# Patient Record
Sex: Male | Born: 1955 | Race: White | Hispanic: No
Health system: Midwestern US, Community
[De-identification: ages and names within clinical notes are randomized; demographics above are authoritative.]

## PROBLEM LIST (undated history)

## (undated) DIAGNOSIS — C801 Malignant (primary) neoplasm, unspecified: Secondary | ICD-10-CM

## (undated) DIAGNOSIS — Z720 Tobacco use: Secondary | ICD-10-CM

## (undated) DIAGNOSIS — C099 Malignant neoplasm of tonsil, unspecified: Secondary | ICD-10-CM

## (undated) DIAGNOSIS — N50819 Testicular pain, unspecified: Secondary | ICD-10-CM

## (undated) DIAGNOSIS — N3000 Acute cystitis without hematuria: Secondary | ICD-10-CM

## (undated) HISTORY — DX: Malignant neoplasm of tonsil, unspecified: C09.9

## (undated) HISTORY — DX: Tobacco use: Z72.0

## (undated) HISTORY — DX: Malignant (primary) neoplasm, unspecified: C80.1

---

## 2007-11-26 DIAGNOSIS — C099 Malignant neoplasm of tonsil, unspecified: Secondary | ICD-10-CM

## 2007-11-26 HISTORY — PX: TONSILLECTOMY: SUR1361

## 2007-11-26 HISTORY — DX: Malignant neoplasm of tonsil, unspecified: C09.9

## 2007-11-26 HISTORY — PX: NECK DISSECTION: SUR422

## 2012-03-09 ENCOUNTER — Encounter (HOSPITAL_COMMUNITY): Payer: Self-pay | Admitting: Oncology

## 2012-03-09 ENCOUNTER — Encounter (HOSPITAL_COMMUNITY): Payer: Self-pay | Attending: Oncology | Admitting: Oncology

## 2012-03-09 DIAGNOSIS — F17200 Nicotine dependence, unspecified, uncomplicated: Secondary | ICD-10-CM | POA: Insufficient documentation

## 2012-03-09 DIAGNOSIS — C099 Malignant neoplasm of tonsil, unspecified: Secondary | ICD-10-CM

## 2012-03-09 DIAGNOSIS — Z8521 Personal history of malignant neoplasm of larynx: Secondary | ICD-10-CM | POA: Insufficient documentation

## 2012-03-09 DIAGNOSIS — Z09 Encounter for follow-up examination after completed treatment for conditions other than malignant neoplasm: Secondary | ICD-10-CM | POA: Insufficient documentation

## 2012-03-09 DIAGNOSIS — C770 Secondary and unspecified malignant neoplasm of lymph nodes of head, face and neck: Secondary | ICD-10-CM

## 2012-03-09 LAB — COMPREHENSIVE METABOLIC PANEL
ALT: 28 U/L (ref 0–53)
Alkaline Phosphatase: 73 U/L (ref 39–117)
BUN: 18 mg/dL (ref 6–23)
CO2: 28 mEq/L (ref 19–32)
Chloride: 101 mEq/L (ref 96–112)
GFR calc Af Amer: 87 mL/min — ABNORMAL LOW (ref >90)
GFR calc non Af Amer: 75 mL/min — ABNORMAL LOW (ref >90)
Glucose, Bld: 103 mg/dL — ABNORMAL HIGH (ref 70–99)
Potassium: 4 mEq/L (ref 3.5–5.1)
Sodium: 138 mEq/L (ref 135–145)
Total Bilirubin: 0.3 mg/dL (ref 0.3–1.2)

## 2012-03-09 LAB — CBC
HCT: 40.5 % (ref 39.0–52.0)
MCHC: 33.3 g/dL (ref 30.0–36.0)
MCV: 94.6 fL (ref 78.0–100.0)
RDW: 12.9 % (ref 11.5–15.5)

## 2012-03-09 LAB — DIFFERENTIAL
Monocytes Absolute: 0.4 10*3/uL (ref 0.1–1.0)
Neutro Abs: 3.8 10*3/uL (ref 1.7–7.7)

## 2012-03-09 NOTE — Progress Notes (Signed)
Problem #1 history of stage IV B. 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 He is status post surgery consisting a right neck excision left and right tonsillar resection and biopsy of the left nasopharynx the right nasopharynx at the [facility] of [place] by Dr. Jeananne Rama on 03/04/2008. He then went on to be treated by Dr. Kathrin Greathouse and Dr. Merton Border sleeper at [facility] with combination chemotherapy and radiation the chemotherapy consisting of cis-platinum daily for 5 day and 5-FU for 5 day weeks #1 and #5. He finished all radiation therapy as of 07/29/2008 he has remained disease-free since. His followup is with Dr. Hazle Coca at [place] once a year. His insurance did not help pay for his surgery or therapy it sounds like.  He still occasionally smokes a cigarette and occasionally has a glass of wine or beer. He is asymptomatic however on oncologic review of systems. He does relate a history of high triglycerides in the past but did not want to be treated with medications for this so he has altered and  his lifestyle and his diet significantly. He exercises on a regular basis. He states he eats almost no red meat. He promises to have his lipids checked in the near future on a fasting basis.  He is separated times many years but not officially divorced. He does not know his family history due to his being adopted. He smoked approximately a pack a day starting in his teenage years. He states he was never a heavy drinker.  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  His vital signs show height is 6 feet 6 inches weight 222 pounds. He has no lymphadenopathy in the cervical supraclavicular infraclavicular axillary or inguinal areas. His throat is clear tongue is normal in the midline and there are no lesions that are suspicious anywhere in the oral cavity  or throat. He has a well-healed scar on the right neck. He does not however have the formal changes of a radical neck dissection. His lungs are clear but with diminished breath sounds. Some of his skin on his chest face and legs are burned due to exposure to the sun yesterday. His heart shows a regular rhythm and rate without murmur rub or gallop. Some of his teeth are missing within the oral cavity. His abdomen is soft nontender without organomegaly masses. He has no peripheral edema. Pulses are intact. He is right handed. His nails and hands show the changes of [place] from being an Journalist, newspaper. Facial symmetry appears intact. He is alert he is oriented.  He needs some blood tests today and I think he should have a low-dose chest CT scan without contrast due to smoking history of approximately 30 or more pack years of smoking. I've encouraged him to followup with Dr. Hazle Coca once a year at [facility]. He can return here for his lipids to be checked or seek out a family physician in the [place] area. I will be happy to see him once a year as well. He needs to quit smoking completely and to avoid any excess alcohol whatsoever.

## 2012-03-09 NOTE — Progress Notes (Signed)
Juan Armstrong presented for labwork. Labs per MD order drawn via Peripheral Line 23 gauge needle inserted in right AC  Good blood return present. Procedure without incident.  Needle removed intact. Patient tolerated procedure well.

## 2012-03-09 NOTE — Patient Instructions (Signed)
Lex Linhares  [id number] July 28, 1956   [facility]  Discharge Instructions  RECOMMENDATIONS MADE BY THE CONSULTANT AND ANY TEST RESULTS WILL BE SENT TO YOUR REFERRING DOCTOR.   EXAM FINDINGS BY MD TODAY AND SIGNS AND SYMPTOMS TO REPORT TO CLINIC OR PRIMARY MD: Exam and discussion per MD.  Will check some labs today and will do a low dose CT scan of your chest.  MEDICATIONS PRESCRIBED: none   INSTRUCTIONS GIVEN AND DISCUSSED: Other :  Report any lumps, bone pain, shortness of breath.  SPECIAL INSTRUCTIONS/FOLLOW-UP: Xray Studies Needed CT of chest  and Return to Clinic in 1 year.    I acknowledge that I have been informed and understand all the instructions given to me and received a copy. I do not have any more questions at this time, but understand that I may call the Specialty Clinic at [facility] at [phone] during business hours should I have any further questions or need assistance in obtaining follow-up care.    __________________________________________  _____________  __________ Signature of Patient or Authorized Representative            Date                   Time    __________________________________________ Nurse's Signature

## 2012-03-10 LAB — CEA: CEA: 1.2 ng/mL (ref 0.0–5.0)

## 2012-03-16 ENCOUNTER — Encounter (HOSPITAL_BASED_OUTPATIENT_CLINIC_OR_DEPARTMENT_OTHER): Payer: Self-pay

## 2012-03-16 ENCOUNTER — Ambulatory Visit (HOSPITAL_COMMUNITY)
Admission: RE | Admit: 2012-03-16 | Discharge: 2012-03-16 | Disposition: A | Discharge status: Home / Self Care | Payer: Self-pay | Source: Ambulatory Visit | Attending: Oncology | Admitting: Oncology

## 2012-03-16 DIAGNOSIS — C760 Malignant neoplasm of head, face and neck: Secondary | ICD-10-CM | POA: Insufficient documentation

## 2012-03-16 LAB — LIPID PANEL
Cholesterol: 263 mg/dL — ABNORMAL HIGH (ref 0–200)
Triglycerides: 463 mg/dL — ABNORMAL HIGH (ref <150)

## 2012-03-16 NOTE — Progress Notes (Signed)
Labs drawn today for lipid panel

## 2012-04-08 ENCOUNTER — Encounter (HOSPITAL_COMMUNITY): Payer: Self-pay | Attending: Oncology

## 2012-04-08 ENCOUNTER — Encounter (HOSPITAL_COMMUNITY): Payer: Self-pay | Admitting: Oncology

## 2012-04-08 DIAGNOSIS — W57XXXA Bitten or stung by nonvenomous insect and other nonvenomous arthropods, initial encounter: Secondary | ICD-10-CM | POA: Insufficient documentation

## 2012-04-08 DIAGNOSIS — T148 Other injury of unspecified body region: Secondary | ICD-10-CM | POA: Insufficient documentation

## 2012-04-08 NOTE — Progress Notes (Unsigned)
Juan Armstrong presented for labwork. Labs per MD order drawn via Peripheral Line 25 gauge needle inserted in rt ac.   Procedure without incident.  Needle removed intact. Patient tolerated procedure well. Pt c/o weakness and aching with fever. H/O tick bite one week ago

## 2012-04-09 ENCOUNTER — Ambulatory Visit (HOSPITAL_COMMUNITY): Payer: Self-pay | Admitting: Oncology

## 2012-04-09 LAB — ROCKY MTN SPOTTED FVR AB, IGM-BLOOD: RMSF IgM: 0.25 IV (ref 0.00–0.89)

## 2012-04-09 LAB — ROCKY MTN SPOTTED FVR AB, IGG-BLOOD: RMSF IgG: 0.42 IV

## 2012-04-09 NOTE — Progress Notes (Signed)
This encounter was created in error - please disregard.

## 2012-06-09 ENCOUNTER — Ambulatory Visit: Payer: Self-pay | Admitting: Cardiology

## 2012-06-12 ENCOUNTER — Encounter: Payer: Self-pay | Admitting: Cardiology

## 2012-06-15 ENCOUNTER — Ambulatory Visit (INDEPENDENT_AMBULATORY_CARE_PROVIDER_SITE_OTHER): Payer: Self-pay | Admitting: Cardiology

## 2012-06-15 ENCOUNTER — Encounter: Payer: Self-pay | Admitting: Cardiology

## 2012-06-15 VITALS — BP 104/70 | HR 47 | Ht 77.5 in | Wt 226.1 lb

## 2012-06-15 DIAGNOSIS — R079 Chest pain, unspecified: Secondary | ICD-10-CM

## 2012-06-15 DIAGNOSIS — G8929 Other chronic pain: Secondary | ICD-10-CM

## 2012-06-15 DIAGNOSIS — R0789 Other chest pain: Secondary | ICD-10-CM

## 2012-06-15 DIAGNOSIS — R071 Chest pain on breathing: Secondary | ICD-10-CM

## 2012-06-15 NOTE — Assessment & Plan Note (Signed)
This is clearly not cardiac. The duration, reproduction with palpation, and EKG being normal all is having pain in the office. He does have significant risk factors which I reviewed with him today.  Encouraged him to have a low carb low animal fat diet. I've encouraged him to walk as much as possible.  No further recommendations at this time. We'll see him back when necessary.

## 2012-06-15 NOTE — Progress Notes (Signed)
HPI Juan Armstrong is referred today by Sim Boast from [facility] for evaluation of chronic chest pain. He has had this for over 10 years.  It was evaluated in [place] about 10 years ago. He had a stress test which was negative per his memory. He also had ultrasound which was normal. I do not have those remote records.  He describes the discomfort as every is left breast and under his left arm. It does not radiate. It is described as an ache. It is made better with exercise. He has no other associated symptoms including nausea, vomiting, diaphoresis but does get pale and it scares him. It can last for hours. In fact is having in the office today.  He denies any hemoptysis no pleurisy-type discomfort. He had CT scan of the chest in April of this year that showed a normal size heart, no pericardial effusion, and no metastases from his previous cancer of the throat.  He denies any presyncope, syncope or palpitations.  His risk factors for coronary artery disease are age, male sex, mixed hyperlipidemia with total cholesterol 271, triglycerides 469, HDL 43. His daughter is with him says it used to be at higher than this until he got a better diet. He is a remote smoker but smokes on occasion with some girlfriends.  Past Medical History  Diagnosis Date  . Cancer   . Tonsillar cancer 2009    Current Outpatient Prescriptions  Medication Sig Dispense Refill  . aspirin 325 MG EC tablet Take 325 mg by mouth as needed.      Marland Kitchen ibuprofen (ADVIL,MOTRIN) 200 MG tablet Take 200 mg by mouth as needed.        No Known Allergies  No family history on file.  History   Social History  . Marital Status: Single    Spouse Name: N/A    Number of Children: N/A  . Years of Education: N/A   Occupational History  . Not on file.   Social History Main Topics  . Smoking status: Former Smoker    Types: Cigarettes    Quit date: 05/25/2012  . Smokeless tobacco: Never Used  . Alcohol Use: 2.4 oz/week    3  Cans of beer, 1 Glasses of wine per week  . Drug Use: Not on file  . Sexually Active: Not on file   Other Topics Concern  . Not on file   Social History Narrative  . No narrative on file    ROS ALL NEGATIVE EXCEPT THOSE NOTED IN HPI  PE  General Appearance: well developed, well nourished in no acute distress HEENT: symmetrical face, PERRLA,   Neck: no JVD, thyromegaly, or adenopathy, trachea midline Chest: symmetric without deformity, mild exacerbation of his pain with palpation of his left anterior chest  Cardiac: PMI non-displaced, RRR, normal, split S1, S2, no gallop or murmur, no click Lung: clear to ausculation and percussion Vascular: all pulses full without bruits  Abdominal: nondistended, nontender, good bowel sounds, no HSM, no bruits Extremities: no cyanosis, clubbing or edema, no sign of DVT, no varicosities  Skin: normal color, no rashes Neuro: alert and oriented x 3, non-focal Pysch: normal affect  EKG Sinus bradycardia, otherwise normal EKG BMET    Component Value Date/Time   NA 138 03/09/2012 1720   K 4.0 03/09/2012 1720   CL 101 03/09/2012 1720   CO2 28 03/09/2012 1720   GLUCOSE 103* 03/09/2012 1720   BUN 18 03/09/2012 1720   CREATININE 1.10 03/09/2012 1720   CALCIUM  10.0 03/09/2012 1720   GFRNONAA 75* 03/09/2012 1720   GFRAA 87* 03/09/2012 1720    Lipid Panel     Component Value Date/Time   CHOL 263* 03/16/2012 0830   TRIG 463* 03/16/2012 0830   HDL 40 03/16/2012 0830   CHOLHDL 6.6 03/16/2012 0830   VLDL UNABLE TO CALCULATE IF TRIGLYCERIDE OVER 400 mg/dL 1/61/0960 4540   LDLCALC UNABLE TO CALCULATE IF TRIGLYCERIDE OVER 400 mg/dL 9/81/1914 7829    CBC    Component Value Date/Time   WBC 5.5 03/09/2012 1720   RBC 4.28 03/09/2012 1720   HGB 13.5 03/09/2012 1720   HCT 40.5 03/09/2012 1720   PLT 194 03/09/2012 1720   MCV 94.6 03/09/2012 1720   MCH 31.5 03/09/2012 1720   MCHC 33.3 03/09/2012 1720   RDW 12.9 03/09/2012 1720   LYMPHSABS 1.2 03/09/2012 1720    MONOABS 0.4 03/09/2012 1720   EOSABS 0.1 03/09/2012 1720   BASOSABS 0.0 03/09/2012 1720

## 2012-06-15 NOTE — Patient Instructions (Addendum)
Your physician discussed the importance of regular exercise and recommended that you start or continue a regular exercise program for good health. 30 minutes a day of walking is a good form of exercise.  Your physician recommends that you continue on your current medications as directed. Please refer to the Current Medication list given to you today.  Your doctor recommends that your follow a heart healthy diet low in saturated fats and low carbohydrates.  Your physician recommends that you schedule a follow-up appointment as needed.

## 2013-01-02 ENCOUNTER — Emergency Department (HOSPITAL_COMMUNITY): Payer: Self-pay

## 2013-01-02 ENCOUNTER — Encounter (HOSPITAL_COMMUNITY): Payer: Self-pay

## 2013-01-02 ENCOUNTER — Emergency Department (HOSPITAL_COMMUNITY)
Admission: EM | Admit: 2013-01-02 | Discharge: 2013-01-02 | Disposition: A | Discharge status: Home / Self Care | Payer: Self-pay | Attending: Emergency Medicine | Admitting: Emergency Medicine

## 2013-01-02 DIAGNOSIS — Z9889 Other specified postprocedural states: Secondary | ICD-10-CM | POA: Insufficient documentation

## 2013-01-02 DIAGNOSIS — Z85819 Personal history of malignant neoplasm of unspecified site of lip, oral cavity, and pharynx: Secondary | ICD-10-CM | POA: Insufficient documentation

## 2013-01-02 DIAGNOSIS — J209 Acute bronchitis, unspecified: Secondary | ICD-10-CM | POA: Insufficient documentation

## 2013-01-02 DIAGNOSIS — Z87891 Personal history of nicotine dependence: Secondary | ICD-10-CM | POA: Insufficient documentation

## 2013-01-02 DIAGNOSIS — Z7982 Long term (current) use of aspirin: Secondary | ICD-10-CM | POA: Insufficient documentation

## 2013-01-02 DIAGNOSIS — Z859 Personal history of malignant neoplasm, unspecified: Secondary | ICD-10-CM | POA: Insufficient documentation

## 2013-01-02 MED ORDER — GUAIFENESIN-CODEINE 100-10 MG/5ML PO SYRP: ORAL_SOLUTION | ORAL | Status: DC

## 2013-01-02 MED ORDER — AZITHROMYCIN 250 MG PO TABS: ORAL_TABLET | ORAL | Status: DC

## 2013-01-02 NOTE — ED Notes (Signed)
States that he has had nasal congestion and cough and left sided chest burning since last Sunday.

## 2013-01-02 NOTE — ED Provider Notes (Signed)
History    This chart was scribed for Juan Cooper III, MD by Charolett Bumpers, ED Scribe. The patient was seen in room APA01/APA01. Patient's care was started at 1022.   CSN: [id number]  Arrival date & time 01/02/13  1000   First MD Initiated Contact with Patient 01/02/13 1022      Chief Complaint  Patient presents with  . Shortness of Breath    The history is provided by the patient. No language interpreter was used.   Juan Armstrong is a 57 y.o. male who presents to the Emergency Department complaining of persistent, gradually worsening SOB that started this morning. He reports that he had cold symptoms for the past week. He reports he has had a productive cough with brown sputum, congestion, sore throat, hoarse voice and a burning sensation in his chest when he coughs. He has taken Sudafed with moderate relief of his cold symptoms but states the SOB and dizziness started this morning. He denies any fever, ear pain, vomiting, diarrhea, difficulty urinating, rash, syncope. He states that he is otherwise normally healthy. He has a h/o neck dissection in 2009 for CA and follows up with Dr. Mariel Sleet. He denies any allergies. He denies any regular medications. He denies any tobacco use but reports occasional alcohol use.   Past Medical History  Diagnosis Date  . Cancer   . Tonsillar cancer 2009    Past Surgical History  Procedure Laterality Date  . Neck dissection  2009    rt  . Tonsillectomy  2009    No family history on file.  History  Substance Use Topics  . Smoking status: Former Smoker    Types: Cigarettes    Quit date: 05/25/2012  . Smokeless tobacco: Never Used  . Alcohol Use: 2.4 oz/week    1 Glasses of wine, 3 Cans of beer per week      Review of Systems  Constitutional: Negative for fever and chills.  HENT: Positive for congestion, sore throat and voice change. Negative for ear pain.   Respiratory: Positive for cough and shortness of breath.    Cardiovascular: Negative for chest pain.  Gastrointestinal: Negative for vomiting and diarrhea.  Genitourinary: Negative for dysuria and difficulty urinating.  Skin: Negative for rash.  Neurological: Positive for dizziness. Negative for syncope.  All other systems reviewed and are negative.    Allergies  Review of patient's allergies indicates no known allergies.  Home Medications   Current Outpatient Rx  Name  Route  Sig  Dispense  Refill  . aspirin 325 MG EC tablet   Oral   Take 325 mg by mouth as needed.         Marland Kitchen ibuprofen (ADVIL,MOTRIN) 200 MG tablet   Oral   Take 200 mg by mouth as needed.           BP 142/91  Pulse 80  Temp(Src) 98.4 F (36.9 C) (Oral)  Resp 17  SpO2 95%  Physical Exam  Nursing note and vitals reviewed. Constitutional: He is oriented to person, place, and time. He appears well-developed and well-nourished. No distress.  HENT:  Head: Normocephalic and atraumatic.  Right Ear: External ear normal.  Left Ear: External ear normal.  Nose: Nose normal.  Mouth/Throat: Posterior oropharyngeal erythema present. No oropharyngeal exudate.  Voice is hoarse.  Eyes: EOM are normal. Pupils are equal, round, and reactive to light.  Neck: Normal range of motion. Neck supple. No tracheal deviation present.  Cardiovascular: Normal rate, regular rhythm and  normal heart sounds.  Exam reveals no gallop and no friction rub.   No murmur heard. Pulmonary/Chest: Effort normal and breath sounds normal. No respiratory distress. He has no wheezes. He has no rhonchi. He has no rales.  Abdominal: Soft. Bowel sounds are normal. He exhibits no distension. There is no tenderness.  Musculoskeletal: Normal range of motion. He exhibits no edema.  Lymphadenopathy:    He has no cervical adenopathy.  Neurological: He is alert and oriented to person, place, and time.  Skin: Skin is warm and dry.  Psychiatric: He has a normal mood and affect. His behavior is normal.    ED  Course  Procedures (including critical care time)  DIAGNOSTIC STUDIES: Oxygen Saturation is 97% on room air, adequate by my interpretation.    COORDINATION OF CARE:  10:22 AM  Date: 01/02/2013  Rate: 76  Rhythm: normal sinus rhythm  QRS Axis: normal  Intervals: normal  ST/T Wave abnormalities: normal  Conduction Disutrbances:none  Narrative Interpretation: Normal EKG  Old EKG Reviewed: none available   10:50-Discussed planned course of treatment with the patient including a chest x-ray, who is agreeable at this time.    Dg Chest 2 View  01/02/2013  *RADIOLOGY REPORT*  Clinical Data: Cough with reduction of brownish sputum, history of laryngeal carcinoma, some shortness of breath  CHEST - 2 VIEW  Comparison: CT chest of 03/16/2012  Findings: No active infiltrate or effusion is seen.  The lungs are slightly hyperaerated.  Mediastinal contours are unremarkable.  The heart is within normal limits in size.  No skeletal abnormality is seen.  IMPRESSION: Hyperaeration.  No active lung disease.   Original Report Authenticated By: Dwyane Dee, M.D.    Rx for bronchitis with Z-Pak and Robitussin AC.   1. Acute bronchitis     I personally performed the services described in this documentation, which was scribed in my presence. The recorded information has been reviewed and is accurate. Osvaldo Human, MD      Juan Cooper III, MD 01/02/13 1200

## 2013-01-02 NOTE — ED Notes (Signed)
Pt reports cough and congestion for last week, fever off and on, hard to catch his breath for the last day.

## 2013-03-09 ENCOUNTER — Encounter (HOSPITAL_COMMUNITY): Payer: Self-pay | Admitting: Oncology

## 2013-03-09 ENCOUNTER — Encounter (HOSPITAL_COMMUNITY): Payer: Self-pay | Attending: Oncology | Admitting: Oncology

## 2013-03-09 VITALS — BP 114/67 | HR 66 | Temp 98.1°F | Resp 16 | Wt 227.0 lb

## 2013-03-09 DIAGNOSIS — C770 Secondary and unspecified malignant neoplasm of lymph nodes of head, face and neck: Secondary | ICD-10-CM

## 2013-03-09 DIAGNOSIS — Z09 Encounter for follow-up examination after completed treatment for conditions other than malignant neoplasm: Secondary | ICD-10-CM | POA: Insufficient documentation

## 2013-03-09 DIAGNOSIS — J449 Chronic obstructive pulmonary disease, unspecified: Secondary | ICD-10-CM

## 2013-03-09 DIAGNOSIS — J4489 Other specified chronic obstructive pulmonary disease: Secondary | ICD-10-CM | POA: Insufficient documentation

## 2013-03-09 DIAGNOSIS — E7800 Pure hypercholesterolemia, unspecified: Secondary | ICD-10-CM | POA: Insufficient documentation

## 2013-03-09 DIAGNOSIS — C099 Malignant neoplasm of tonsil, unspecified: Secondary | ICD-10-CM

## 2013-03-09 DIAGNOSIS — C760 Malignant neoplasm of head, face and neck: Secondary | ICD-10-CM

## 2013-03-09 DIAGNOSIS — Z85819 Personal history of malignant neoplasm of unspecified site of lip, oral cavity, and pharynx: Secondary | ICD-10-CM | POA: Insufficient documentation

## 2013-03-09 LAB — CBC WITH DIFFERENTIAL/PLATELET
Basophils Absolute: 0 10*3/uL (ref 0.0–0.1)
HCT: 43.1 % (ref 39.0–52.0)
Hemoglobin: 14.8 g/dL (ref 13.0–17.0)
Lymphocytes Relative: 24 % (ref 12–46)
Monocytes Absolute: 0.4 10*3/uL (ref 0.1–1.0)
Monocytes Relative: 7 % (ref 3–12)
Neutro Abs: 3.1 10*3/uL (ref 1.7–7.7)
Neutrophils Relative %: 65 % (ref 43–77)
RDW: 12.9 % (ref 11.5–15.5)
WBC: 4.7 10*3/uL (ref 4.0–10.5)

## 2013-03-09 NOTE — Progress Notes (Signed)
Diagnosis #1 history of stage IV B. 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 He underwent surgery consisting of a right neck excision on the left and a right tonsillar resection and biopsy left nasopharynx, the right nasopharynx at the [facility] by Dr. Jeananne Rama on 03/04/2008. He then went on be treated by Dr. Kathrin Greathouse and Dr. Merton Border sleeper at [place], [place] with combination chemotherapy and radiation therapy, the chemotherapy consisting of cis-platinum daily for 5 days with 5-FU for 5 days on weeks #1 and #5. He finished all therapy as of 07/29/2008 thus far remains disease free. He has not been back to [facility] in several years.  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 He has smoked more than 30 pack years.  His oncology review of systems is noncontributory presently. Is not aware of any lumps or bumps anywhere. No trouble swallowing appears very stores and his mouth and throat. His appetite is excellent. He has gained 5 pounds since last year. His ideal weight is close to 214-220 pounds. He weighs 227 pounds now.  Other vital signs are stable. He still working full-time. He does not get much in the way of exercise. He is using 1-2 glasses of wine with dinner. He is still taking care of his youngest children.  He denies any blood in the stools blood in his urine trouble urinating shortness of breath chest pain cough etc. appear  He is still separated but has a significant other.  He does state that within the last year he found out that his father died at age 12 and he is going to try to find out the cause. He was estranged from his biological parents since he was adopted. He does note his mother still alive.  BP 114/67  Pulse 66  Temp(Src) 98.1 F (36.7 C) (Oral)  Resp 16  Wt 227 lb (102.967  kg)  BMI 26.56 kg/m2  He is in no acute distress. Most of his remaining teeth are in fair shape. He could use some attention. He has no obvious lesions both by palpation and by light evaluation within the mouth or throat area. He has no lymphadenopathy in the neck, supraclavicular, infraclavicular, axillary, epitrochlear or inguinal areas. His lungs are clear with mildly diminished breath sounds. His heart shows a regular rhythm and rate without murmur rub or gallop. Abdomen is soft and nontender without hepatosplenomegaly. Bowel sounds are normal. Skin exam is negative. He has no thyromegaly. He has no leg edema or arm edema, bowel sounds are normal.  He does need to see her primary care physician who hopefully will manage his cholesterol which was elevated the last time he was here but E. did not get her primary care physician as recommended.  He does need a TSH level at some point we will try to arrange that. Also needs a CBC and differential.  The CBC and differential is actually back and looks very good today. With the help of his daughter he will arrange for a primary care physician to see him within the next 4 weeks. We will see him in a year but still have a CT of his chest because of smoking history done within the next couple weeks. You also need one next year as well

## 2013-03-09 NOTE — Patient Instructions (Addendum)
[facility] Discharge Instructions  RECOMMENDATIONS MADE BY THE CONSULTANT AND ANY TEST RESULTS WILL BE SENT TO YOUR REFERRING PHYSICIAN.  EXAM FINDINGS BY THE PHYSICIAN TODAY AND SIGNS OR SYMPTOMS TO REPORT TO CLINIC OR PRIMARY PHYSICIAN: Exam and discussion by MD.  No evidence of recurrence by exam.  Need to get Primary Care Physician and get a physical.  Will do CT of chest next week and will check a CBC today.  MEDICATIONS PRESCRIBED:  none  INSTRUCTIONS GIVEN AND DISCUSSED: Report any night sweats, new lumps, etc.  SPECIAL INSTRUCTIONS/FOLLOW-UP: Follow- up with Primary Care Physician for physical - need your cholesterol checked.  If not done within 1 month call us and we will check it. Return in 1 year to be seen in follow-up.  Thank you for choosing [facility] to provide your oncology and hematology care.  To afford each patient quality time with our providers, please arrive at least 15 minutes before your scheduled appointment time.  With your help, our goal is to use those 15 minutes to complete the necessary work-up to ensure our physicians have the information they need to help with your evaluation and healthcare recommendations.    Effective January 1st, 2014, we ask that you re-schedule your appointment with our physicians should you arrive 10 or more minutes late for your appointment.  We strive to give you quality time with our providers, and arriving late affects you and other patients whose appointments are after yours.    Again, thank you for choosing [facility].  Our hope is that these requests will decrease the amount of time that you wait before being seen by our physicians.       _____________________________________________________________  Should you have questions after your visit to [facility], please contact our office at [phone] between the hours of 8:30 a.m. and 5:00 p.m.  Voicemails left after  4:30 p.m. will not be returned until the following business day.  For prescription refill requests, have your pharmacy contact our office with your prescription refill request.

## 2013-03-15 ENCOUNTER — Encounter (HOSPITAL_COMMUNITY): Payer: Self-pay

## 2013-03-16 ENCOUNTER — Ambulatory Visit (HOSPITAL_COMMUNITY): Payer: Self-pay

## 2013-03-16 ENCOUNTER — Other Ambulatory Visit (HOSPITAL_COMMUNITY): Payer: Self-pay

## 2014-02-01 ENCOUNTER — Other Ambulatory Visit (HOSPITAL_COMMUNITY): Payer: Self-pay | Admitting: Oncology

## 2014-02-01 DIAGNOSIS — C099 Malignant neoplasm of tonsil, unspecified: Secondary | ICD-10-CM

## 2014-02-01 DIAGNOSIS — R0789 Other chest pain: Principal | ICD-10-CM

## 2014-02-01 DIAGNOSIS — G8929 Other chronic pain: Secondary | ICD-10-CM

## 2014-02-01 DIAGNOSIS — Z720 Tobacco use: Secondary | ICD-10-CM

## 2014-02-07 IMAGING — CR DG CHEST 2V
4 series · 4 of 4 positions shown · non-contrast
Comparison: CT chest of 03/16/2012

CLINICAL DATA: Cough with reduction of brownish sputum, history of
laryngeal carcinoma, some shortness of breath

CHEST - 2 VIEW

[view not recorded (1 of 4)]
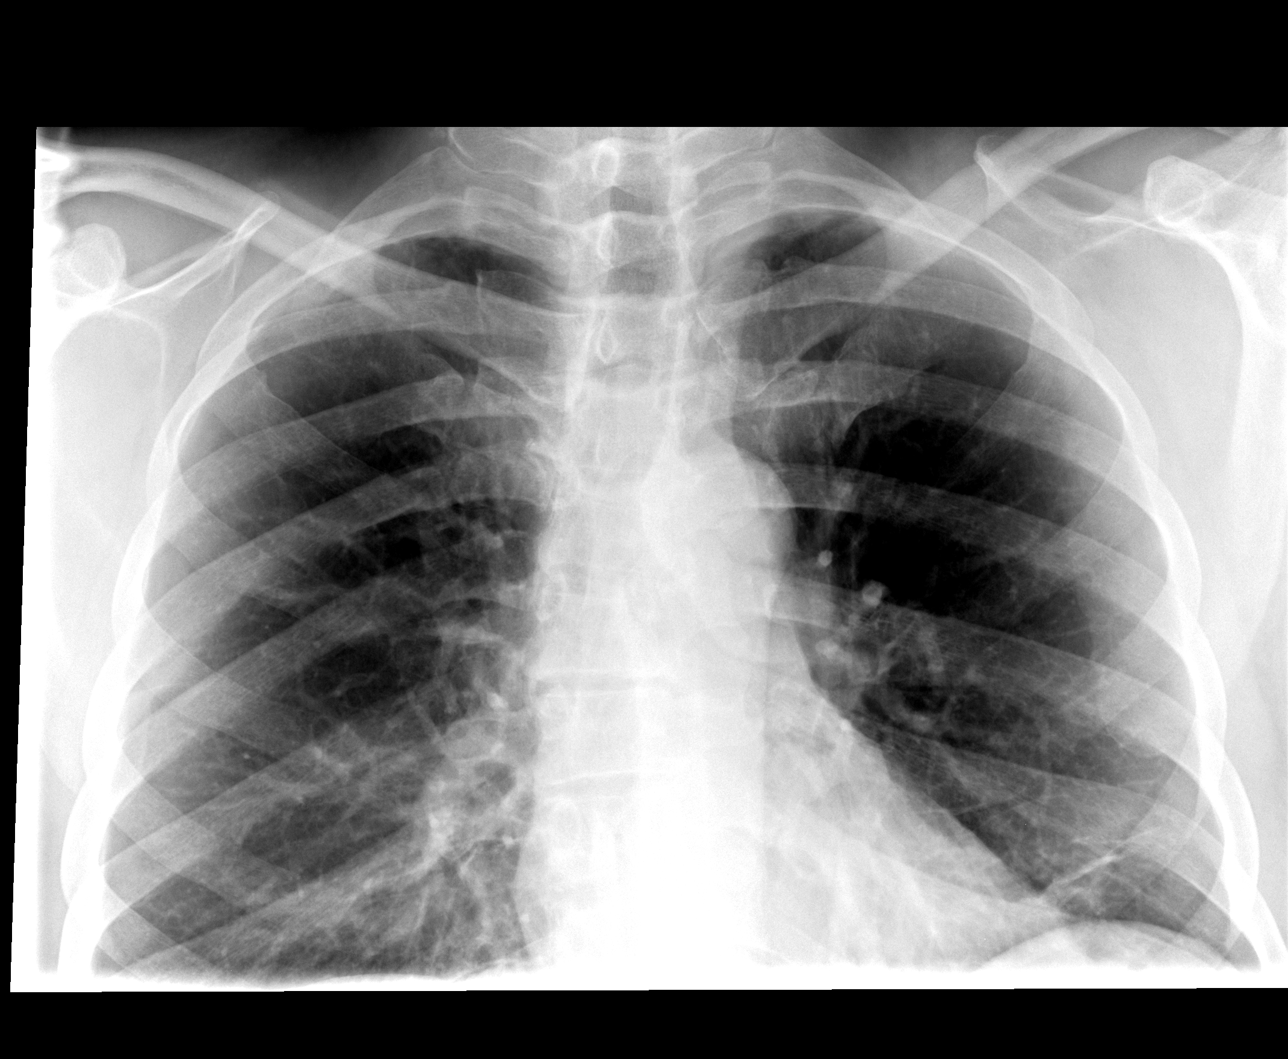

[view not recorded (2 of 4)]
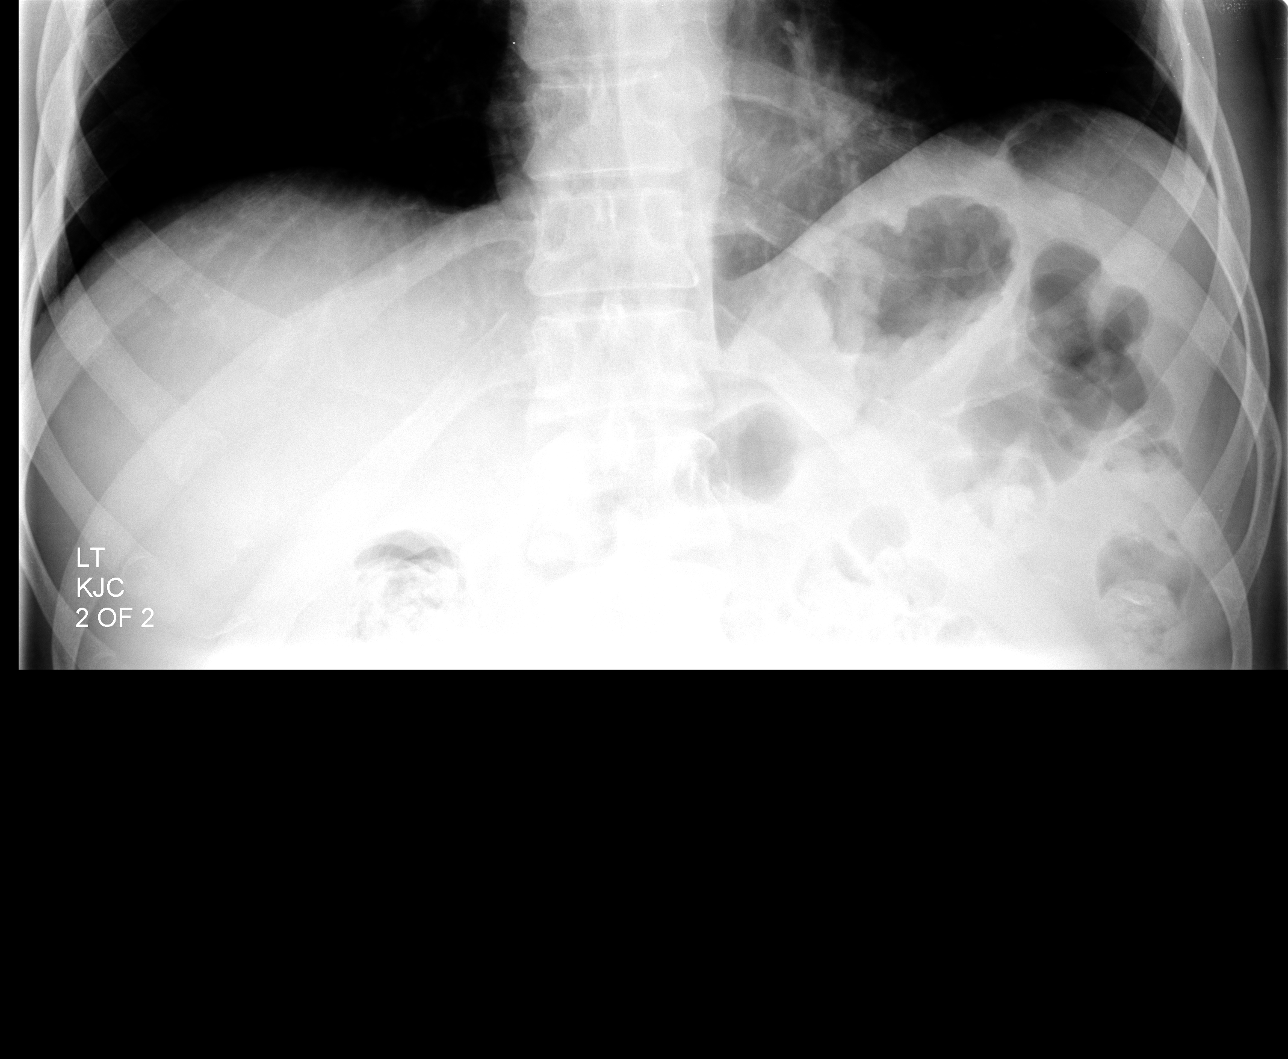

[view not recorded (3 of 4)]
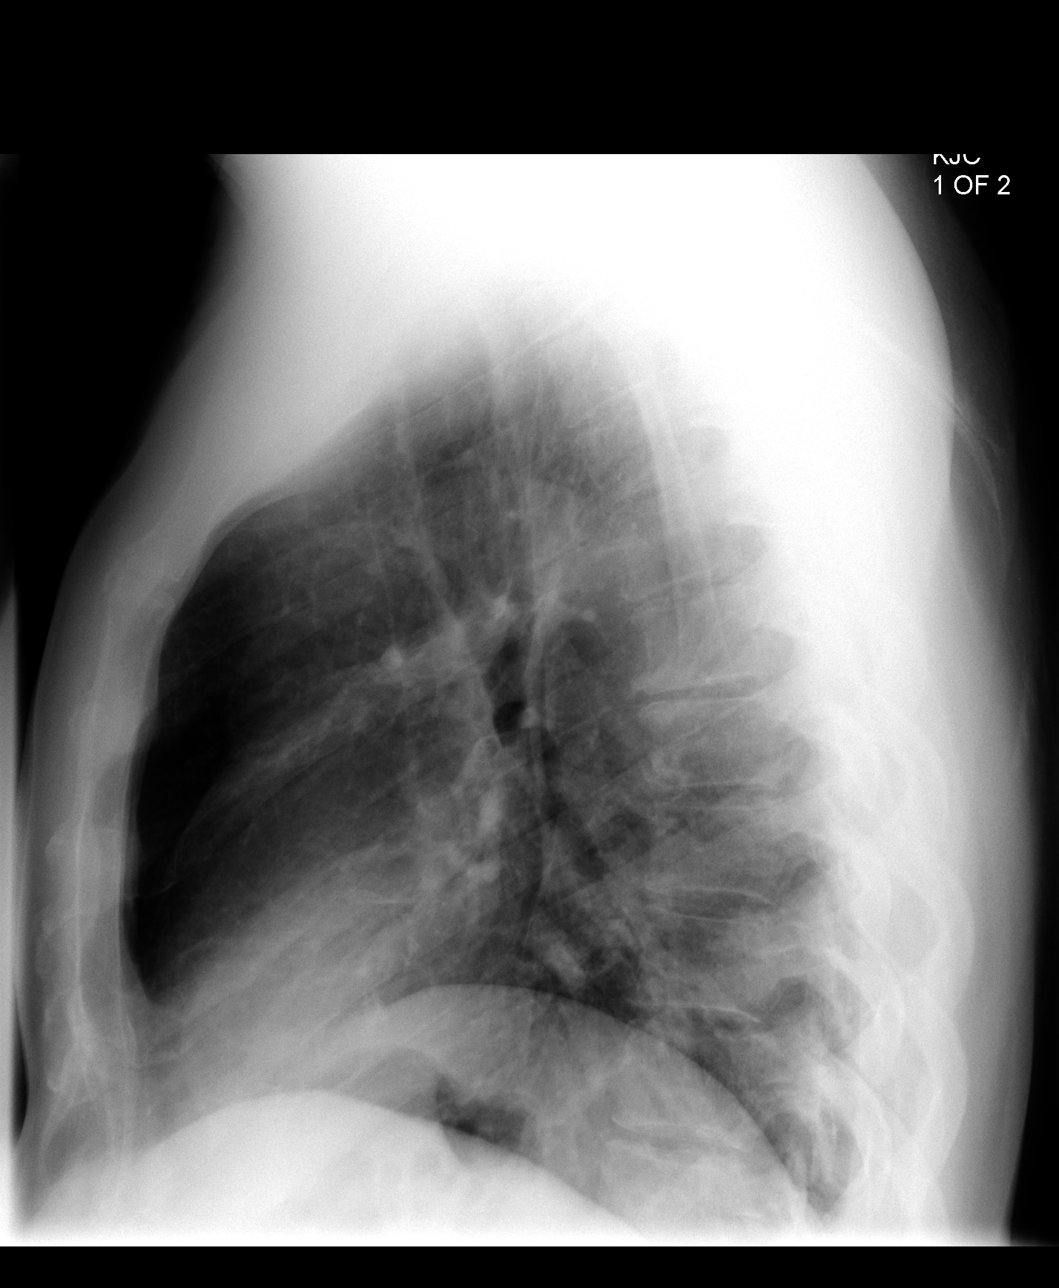

[view not recorded (4 of 4)]
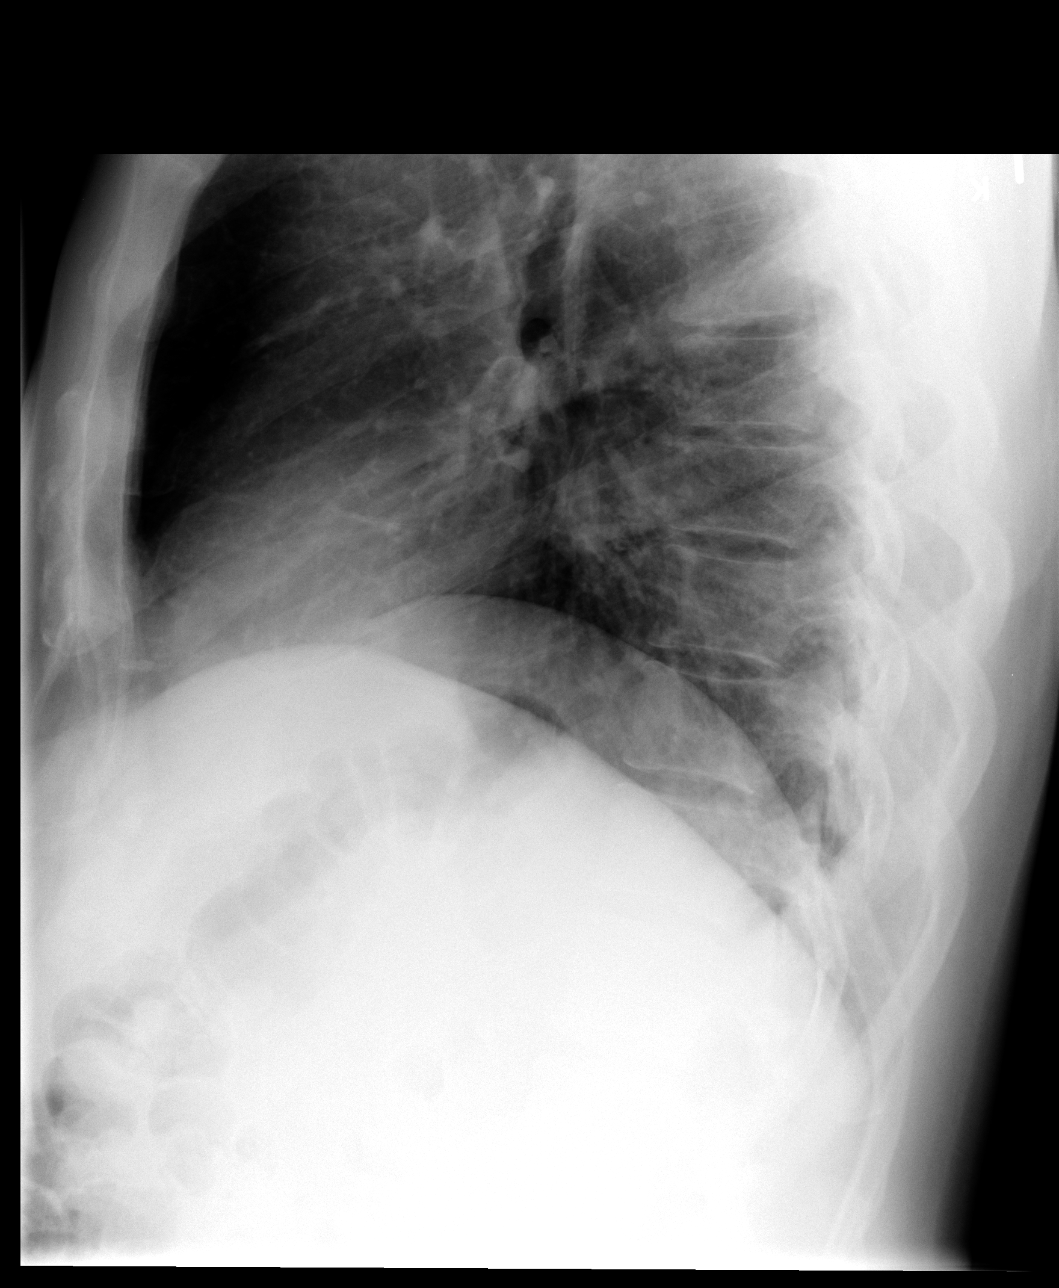

[4 of 4 positions shown; findings below may reference images not displayed]

FINDINGS: No active infiltrate or effusion is seen.  The lungs are
slightly hyperaerated.  Mediastinal contours are unremarkable.  The
heart is within normal limits in size.  No skeletal abnormality is
seen.
IMPRESSION: Hyperaeration.  No active lung disease.

## 2014-02-22 ENCOUNTER — Ambulatory Visit (HOSPITAL_COMMUNITY): Admission: RE | Admit: 2014-02-22 | Payer: No Typology Code available for payment source | Source: Ambulatory Visit

## 2014-03-09 ENCOUNTER — Ambulatory Visit (HOSPITAL_COMMUNITY): Payer: Self-pay

## 2014-03-10 NOTE — Progress Notes (Signed)
This encounter was created in error - please disregard.

## 2014-03-11 ENCOUNTER — Telehealth (HOSPITAL_COMMUNITY): Payer: Self-pay | Admitting: Oncology

## 2014-03-11 NOTE — Telephone Encounter (Signed)
[facility] REVIEWED BY [employer]) [phone]) [phone])

## 2014-03-13 ENCOUNTER — Encounter (HOSPITAL_COMMUNITY): Payer: Self-pay | Admitting: Oncology

## 2014-03-13 DIAGNOSIS — C099 Malignant neoplasm of tonsil, unspecified: Secondary | ICD-10-CM

## 2014-03-13 HISTORY — DX: Malignant neoplasm of tonsil, unspecified: C09.9

## 2014-03-13 NOTE — Progress Notes (Signed)
No PCP Per Patient No address on file  Squamous cell carcinoma of right tonsil - Plan: Omega-3 Fatty Acids (FISH OIL) 1000 MG CAPS, ferrous sulfate 325 (65 FE) MG tablet, Lipid panel, CT Chest Low Dose F/U Screening Wo Contrast  Tobacco abuse - Plan: Omega-3 Fatty Acids (FISH OIL) 1000 MG CAPS, ferrous sulfate 325 (65 FE) MG tablet, Lipid panel, CT Chest Low Dose F/U Screening Wo Contrast  CURRENT THERAPY: Surveillance per NCCN guidelines  INTERVAL HISTORY: Juan Armstrong 58 y.o. male returns for  regular  visit for followup of history of stage IV B. 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 He underwent surgery consisting of a right neck dissection and a right tonsillar resection and biopsy left nasopharynx, the right nasopharynx at the [facility] by Dr. Melven Armstrong on 03/04/2008. He then went on be treated by Dr. Nila Armstrong and Dr. Arnell Armstrong sleeper at [place], [place] with combination chemotherapy and radiation therapy, the chemotherapy consisting of cisplatin daily for 5 days with 5-FU for 5 days on weeks #1 and #5. He finished all therapy as of 07/29/2008 thus far remains disease free. He has not been back to [facility] in several years.  I personally reviewed and went over radiographic studies with the patient.  The results are noted within this dictation.  CT of chest demonstrates: Lung-RADS Category 1, negative. Continue annual screening with  low-dose chest CT without contrast in 12 months.    NCCN guidelines for surveillance of Head and Neck cancer recommends:  A. H+P every 1-3 months for year 1  B. H+P every 2-6 months for year 2  C. H+P every 4-8 months for years 3-5  D. H+P every year for years greater than 5  E. Imaging only as clinically indicated following baseline imaging study within 6 months of completion of therapy.   F. TSH every  6-12 months.  G. Chest imaging as clinically indicated for patients with smoking history  H. Speech/hearing and swallowing evaluation and rehabilitation as clinically indicated.  I. Smoking cessation and EtOH counseling as clinically indicated.  J. Dental evaluation: recommended for oral cavity and sites exposed to significant intraoral radiation treatment.  K. Consider EBV DNA monitoring for nasopharyngeal cancer.  He has not been followed by ENT since he left [facility].  I will refer him to Dr. Benjamine Armstrong for surveillance.  He otherwise denies any complaints and ROS questioning is negative.   He continues to smoke and smoking cessation education was provided.   Past Medical History  Diagnosis Date  . Cancer   . Tonsillar cancer 2009  . Squamous cell carcinoma of right tonsil 03/13/2014    has Chronic chest wall pain and Squamous cell carcinoma of right tonsil on his problem list.     has No Known Allergies.  Juan Armstrong does not currently have medications on file.  Past Surgical History  Procedure Laterality Date  . Neck dissection  2009    rt  . Tonsillectomy  2009    Denies any headaches, dizziness, double vision, fevers, chills, night sweats, nausea, vomiting, diarrhea, constipation, chest pain, heart palpitations, shortness of breath, blood in stool, black tarry stool, urinary pain, urinary burning, urinary frequency, hematuria.   PHYSICAL EXAMINATION  ECOG PERFORMANCE STATUS: 0 - Asymptomatic  Filed Vitals:   03/16/14 1503  BP: 124/68  Pulse: 71  Temp: 97.9 F (36.6 C)  Resp: 20    GENERAL:alert, no distress, well nourished, well developed, comfortable, cooperative, smiling and thickened facial skin from smoking. SKIN: skin color, texture, turgor are normal, no rashes or significant lesions HEAD: Normocephalic, No masses, lesions, tenderness or abnormalities EYES: normal, PERRLA, EOMI, Conjunctiva are pink and non-injected EARS: External ears normal OROPHARYNX:mucous  membranes are moist  NECK: supple, no adenopathy, thyroid normal size, non-tender, without nodularity, no stridor, non-tender, trachea midline, right neck changes associated with right neck dissection without any obvious lymphadenopathy in the neck bilaterally.  Minimal thickening in the left anterior neck but without gross adenopathy. LYMPH:  no palpable lymphadenopathy BREAST:not examined LUNGS: clear to auscultation and percussion HEART: regular rate & rhythm, no murmurs and no gallops ABDOMEN:non-tender and normal bowel sounds BACK: Back symmetric, no curvature. EXTREMITIES:less then 2 second capillary refill, no joint deformities, effusion, or inflammation, no skin discoloration, no cyanosis  NEURO: alert & oriented x 3 with fluent speech, no focal motor/sensory deficits, gait normal   LABORATORY DATA: CBC    Component Value Date/Time   WBC 4.7 03/09/2013 0957   RBC 4.62 03/09/2013 0957   HGB 14.8 03/09/2013 0957   HCT 43.1 03/09/2013 0957   PLT 194 03/09/2013 0957   MCV 93.3 03/09/2013 0957   MCH 32.0 03/09/2013 0957   MCHC 34.3 03/09/2013 0957   RDW 12.9 03/09/2013 0957   LYMPHSABS 1.1 03/09/2013 0957   MONOABS 0.4 03/09/2013 0957   EOSABS 0.2 03/09/2013 0957   BASOSABS 0.0 03/09/2013 0957     RADIOGRAPHIC STUDIES:  03/14/2014  CLINICAL DATA: Lung cancer screening. Patient reportedly quit  smoking 05/25/2012.  EXAM:  CT CHEST LOW DOSE FOLLOW UP SCREENING WITHOUT CONTRAST  TECHNIQUE:  Multidetector CT imaging of the chest was performed following the  standard protocol without IV contrast.  COMPARISON: 03/16/2012.  FINDINGS:  No pathologically enlarged mediastinal lymph nodes. Hilar regions  are difficult to definitively evaluate without IV contrast. There  does appear to be a calcified right hilar lymph node. No axillary  adenopathy. Heart size normal. No pericardial effusion.  Biapical pleural parenchymal scarring. Linear scarring in the left  lower lobe. Lungs are  otherwise clear. No pleural fluid. Airway is  unremarkable.  Incidental imaging of the upper abdomen shows the visualized  portions of the liver, gallbladder, adrenal glands, kidneys, spleen,  pancreas, stomach and bowel to be grossly unremarkable. No worrisome  lytic or sclerotic lesions. A benign hemangioma or fatty deposit in  the T9 vertebral body is again noted.  IMPRESSION:  Lung-RADS Category 1, negative. Continue annual screening with  low-dose chest CT without contrast in 12 months. These results will  be called to the ordering clinician or representative by the  Radiologist Assistant, and communication documented in the PACS  Dashboard.  Electronically Signed  By: Lorin Picket M.D.  On: 03/14/2014 16:20     ASSESSMENT:  1. History of stage IV B. 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 He underwent surgery consisting of a right neck dissection and a right tonsillar resection and biopsy left nasopharynx, the right nasopharynx at the [facility] by Dr. Melven Armstrong on 03/04/2008. He then went on be treated by Dr. Nila Armstrong and Dr. Arnell Armstrong sleeper at [place], [place] with combination chemotherapy and radiation therapy, the chemotherapy consisting of cisplatin daily for 5 days with 5-FU for 5 days on weeks #1 and #5.  He finished all therapy as of 07/29/2008 thus far remains disease free. He has not been back to [facility] in several years. 2. Tobacco abuse, will complete 3 years worth of surveillance in 2015.  Patient Active Problem List   Diagnosis Date Noted  . Squamous cell carcinoma of right tonsil 03/13/2014  . Chronic chest wall pain 06/15/2012    PLAN:  1. I personally reviewed and went over laboratory results with the patient.  The results are noted within this dictation. 2. I personally reviewed and went over radiographic studies  with the patient.  The results are noted within this dictation.  3. Labs in the near future (fasting): CBC diff, CMET, LDH, lipid panel 4. Labs in 1 year: CBC diff, CMET, TSH, LDH 5. CT of chest wo contrast screening for the final exam for smoking history in 12 months 6. Refer to Dr. Benjamine Armstrong for surveillance 7. Return in 12 months for follow-up.   THERAPY PLAN:  NCCN guidelines for surveillance of Head and Neck cancer recommends:  A. H+P every 1-3 months for year 1  B. H+P every 2-6 months for year 2  C. H+P every 4-8 months for years 3-5  D. H+P every year for years greater than 5  E. Imaging only as clinically indicated following baseline imaging study within 6 months of completion of therapy.   F. TSH every 6-12 months.  G. Chest imaging as clinically indicated for patients with smoking history  H. Speech/hearing and swallowing evaluation and rehabilitation as clinically indicated.  I. Smoking cessation and EtOH counseling as clinically indicated.  J. Dental evaluation: recommended for oral cavity and sites exposed to significant intraoral radiation treatment.  K. Consider EBV DNA monitoring for nasopharyngeal cancer.   All questions were answered. The patient knows to call the clinic with any problems, questions or concerns. We can certainly see the patient much sooner if necessary.  Patient and plan discussed with Dr. Farrel Gobble and he is in agreement with the aforementioned.   Baird Cancer 03/16/2014

## 2014-03-14 ENCOUNTER — Ambulatory Visit (HOSPITAL_COMMUNITY)
Admission: RE | Admit: 2014-03-14 | Discharge: 2014-03-14 | Disposition: A | Discharge status: Home / Self Care | Payer: No Typology Code available for payment source | Source: Ambulatory Visit | Attending: Oncology | Admitting: Oncology

## 2014-03-14 DIAGNOSIS — Z87891 Personal history of nicotine dependence: Secondary | ICD-10-CM | POA: Insufficient documentation

## 2014-03-14 DIAGNOSIS — Z720 Tobacco use: Secondary | ICD-10-CM

## 2014-03-14 DIAGNOSIS — C099 Malignant neoplasm of tonsil, unspecified: Secondary | ICD-10-CM

## 2014-03-14 DIAGNOSIS — R0789 Other chest pain: Secondary | ICD-10-CM

## 2014-03-14 DIAGNOSIS — Z122 Encounter for screening for malignant neoplasm of respiratory organs: Secondary | ICD-10-CM | POA: Insufficient documentation

## 2014-03-14 DIAGNOSIS — G8929 Other chronic pain: Secondary | ICD-10-CM

## 2014-03-16 ENCOUNTER — Encounter (HOSPITAL_COMMUNITY): Payer: No Typology Code available for payment source | Attending: Oncology | Admitting: Oncology

## 2014-03-16 VITALS — BP 124/68 | HR 71 | Temp 97.9°F | Resp 20 | Wt 234.6 lb

## 2014-03-16 DIAGNOSIS — R071 Chest pain on breathing: Secondary | ICD-10-CM | POA: Insufficient documentation

## 2014-03-16 DIAGNOSIS — Z923 Personal history of irradiation: Secondary | ICD-10-CM | POA: Insufficient documentation

## 2014-03-16 DIAGNOSIS — F17200 Nicotine dependence, unspecified, uncomplicated: Secondary | ICD-10-CM | POA: Insufficient documentation

## 2014-03-16 DIAGNOSIS — C770 Secondary and unspecified malignant neoplasm of lymph nodes of head, face and neck: Secondary | ICD-10-CM

## 2014-03-16 DIAGNOSIS — Z9221 Personal history of antineoplastic chemotherapy: Secondary | ICD-10-CM | POA: Insufficient documentation

## 2014-03-16 DIAGNOSIS — C099 Malignant neoplasm of tonsil, unspecified: Secondary | ICD-10-CM

## 2014-03-16 DIAGNOSIS — Z85819 Personal history of malignant neoplasm of unspecified site of lip, oral cavity, and pharynx: Secondary | ICD-10-CM | POA: Insufficient documentation

## 2014-03-16 DIAGNOSIS — Z720 Tobacco use: Secondary | ICD-10-CM

## 2014-03-16 DIAGNOSIS — Z09 Encounter for follow-up examination after completed treatment for conditions other than malignant neoplasm: Secondary | ICD-10-CM | POA: Insufficient documentation

## 2014-03-16 DIAGNOSIS — G8929 Other chronic pain: Secondary | ICD-10-CM | POA: Insufficient documentation

## 2014-03-16 NOTE — Patient Instructions (Signed)
[place] Discharge Instructions  RECOMMENDATIONS MADE BY THE CONSULTANT AND ANY TEST RESULTS WILL BE SENT TO YOUR REFERRING PHYSICIAN.  EXAM FINDINGS BY THE PHYSICIAN TODAY AND SIGNS OR SYMPTOMS TO REPORT TO CLINIC OR PRIMARY PHYSICIAN:   Return to [place] for a fasting cholesterol and other labs in the morning  Referral to Dr. Benjamine Mola for surveillance  CT scan of chest in 1 year  Return in 1 year after CT scan to see Juan Armstrong     Thank you for choosing [facility] to provide your oncology and hematology care.  To afford each patient quality time with our providers, please arrive at least 15 minutes before your scheduled appointment time.  With your help, our goal is to use those 15 minutes to complete the necessary work-up to ensure our physicians have the information they need to help with your evaluation and healthcare recommendations.    Effective January 1st, 2014, we ask that you re-schedule your appointment with our physicians should you arrive 10 or more minutes late for your appointment.  We strive to give you quality time with our providers, and arriving late affects you and other patients whose appointments are after yours.    Again, thank you for choosing [facility].  Our hope is that these requests will decrease the amount of time that you wait before being seen by our physicians.       _____________________________________________________________  Should you have questions after your visit to [facility], please contact our office at [phone] between the hours of 8:30 a.m. and 5:00 p.m.  Voicemails left after 4:30 p.m. will not be returned until the following business day.  For prescription refill requests, have your pharmacy contact our office with your prescription refill request.

## 2014-03-24 ENCOUNTER — Encounter (HOSPITAL_BASED_OUTPATIENT_CLINIC_OR_DEPARTMENT_OTHER): Payer: No Typology Code available for payment source

## 2014-03-24 DIAGNOSIS — C099 Malignant neoplasm of tonsil, unspecified: Secondary | ICD-10-CM

## 2014-03-24 DIAGNOSIS — Z720 Tobacco use: Secondary | ICD-10-CM

## 2014-03-24 LAB — LIPID PANEL
CHOL/HDL RATIO: 6.9 ratio
CHOLESTEROL: 262 mg/dL — AB (ref 0–200)
HDL: 38 mg/dL — ABNORMAL LOW (ref >39)
LDL CALC: UNDETERMINED mg/dL (ref 0–99)
Triglycerides: 410 mg/dL — ABNORMAL HIGH (ref <150)
VLDL: UNDETERMINED mg/dL (ref 0–40)

## 2014-03-24 LAB — CBC WITH DIFFERENTIAL/PLATELET
BASOS PCT: 1 % (ref 0–1)
Basophils Absolute: 0 10*3/uL (ref 0.0–0.1)
Eosinophils Absolute: 0.2 10*3/uL (ref 0.0–0.7)
Eosinophils Relative: 5 % (ref 0–5)
HCT: 42.1 % (ref 39.0–52.0)
Hemoglobin: 14.4 g/dL (ref 13.0–17.0)
LYMPHS ABS: 1.1 10*3/uL (ref 0.7–4.0)
Lymphocytes Relative: 31 % (ref 12–46)
MCH: 32.5 pg (ref 26.0–34.0)
MCHC: 34.2 g/dL (ref 30.0–36.0)
MCV: 95 fL (ref 78.0–100.0)
Monocytes Absolute: 0.4 10*3/uL (ref 0.1–1.0)
Monocytes Relative: 10 % (ref 3–12)
NEUTROS ABS: 2 10*3/uL (ref 1.7–7.7)
NEUTROS PCT: 53 % (ref 43–77)
PLATELETS: 188 10*3/uL (ref 150–400)
RBC: 4.43 MIL/uL (ref 4.22–5.81)
RDW: 13.2 % (ref 11.5–15.5)
WBC: 3.7 10*3/uL — ABNORMAL LOW (ref 4.0–10.5)

## 2014-03-24 LAB — COMPREHENSIVE METABOLIC PANEL
ALBUMIN: 3.9 g/dL (ref 3.5–5.2)
ALK PHOS: 64 U/L (ref 39–117)
ALT: 31 U/L (ref 0–53)
AST: 26 U/L (ref 0–37)
BUN: 17 mg/dL (ref 6–23)
CO2: 28 mEq/L (ref 19–32)
Calcium: 9.7 mg/dL (ref 8.4–10.5)
Chloride: 102 mEq/L (ref 96–112)
Creatinine, Ser: 0.98 mg/dL (ref 0.50–1.35)
GFR calc Af Amer: >90 mL/min (ref >90)
GFR calc non Af Amer: >90 mL/min (ref >90)
Glucose, Bld: 112 mg/dL — ABNORMAL HIGH (ref 70–99)
POTASSIUM: 4.6 meq/L (ref 3.7–5.3)
SODIUM: 140 meq/L (ref 137–147)
Total Bilirubin: 0.2 mg/dL — ABNORMAL LOW (ref 0.3–1.2)
Total Protein: 7.5 g/dL (ref 6.0–8.3)

## 2014-03-24 LAB — LACTATE DEHYDROGENASE: LDH: 180 U/L (ref 94–250)

## 2014-03-24 LAB — TSH: TSH: 5.76 u[IU]/mL — ABNORMAL HIGH (ref 0.350–4.500)

## 2014-03-24 NOTE — Progress Notes (Signed)
Labs drawn today for lipid,cbc/diff,cmp,tsh,ldh

## 2014-08-16 ENCOUNTER — Other Ambulatory Visit (HOSPITAL_COMMUNITY): Payer: Self-pay | Admitting: Oncology

## 2014-08-16 ENCOUNTER — Encounter (HOSPITAL_COMMUNITY): Payer: Self-pay | Admitting: Lab

## 2014-08-16 ENCOUNTER — Encounter (HOSPITAL_COMMUNITY): Payer: Self-pay | Admitting: Oncology

## 2014-08-16 NOTE — Progress Notes (Signed)
Referral made to Dr Chase Caller at [facility] - Pulmonary  App. 10/5

## 2014-08-24 ENCOUNTER — Telehealth (HOSPITAL_COMMUNITY): Payer: Self-pay

## 2014-08-24 ENCOUNTER — Encounter (HOSPITAL_COMMUNITY): Payer: Self-pay | Admitting: Oncology

## 2014-08-24 NOTE — Telephone Encounter (Signed)
Message copied by Mellissa Kohut on Wed Aug 24, 2014 12:12 PM ------      Message from: Baird Cancer      Created: Wed Aug 24, 2014  9:18 AM       Whitney requested a letter regarding her father.  Letter is complete.  Please let her know.  We can mail it if she wishes (just find out if we mail it to her address or her father's)            KEFALAS,THOMAS       ------

## 2014-08-24 NOTE — Telephone Encounter (Signed)
Message left for Whitney to call office and let us know how she wants the letter handled.

## 2014-08-24 NOTE — Telephone Encounter (Signed)
Call back from [place] and requested letter be mailed to her at [address], [place], [place].  Letter sent.

## 2014-08-24 NOTE — Telephone Encounter (Signed)
Message copied by Mellissa Kohut on Wed Aug 24, 2014  1:25 PM ------      Message from: Baird Cancer      Created: Wed Aug 24, 2014  9:18 AM       Whitney requested a letter regarding her father.  Letter is complete.  Please let her know.  We can mail it if she wishes (just find out if we mail it to her address or her father's)            KEFALAS,THOMAS       ------

## 2014-08-29 ENCOUNTER — Encounter (INDEPENDENT_AMBULATORY_CARE_PROVIDER_SITE_OTHER): Payer: Self-pay

## 2014-08-29 ENCOUNTER — Ambulatory Visit (INDEPENDENT_AMBULATORY_CARE_PROVIDER_SITE_OTHER): Payer: No Typology Code available for payment source | Admitting: Internal Medicine

## 2014-08-29 ENCOUNTER — Encounter: Payer: Self-pay | Admitting: Internal Medicine

## 2014-08-29 VITALS — BP 130/90 | HR 79 | Ht 78.0 in | Wt 229.0 lb

## 2014-08-29 DIAGNOSIS — R0789 Other chest pain: Secondary | ICD-10-CM

## 2014-08-29 DIAGNOSIS — G8929 Other chronic pain: Secondary | ICD-10-CM

## 2014-08-29 NOTE — Patient Instructions (Signed)
#  Left sided chest pain  --not sure if there is a pulmonary cause for this  - to be on 5000% sure we can do d-dimer blood test for blood clot and get pft breathing test - you can also consider discussing C - and T- spine MRI with your primary care doctor - think about these and let me know how you wish to proceed   #Followup - as needed

## 2014-08-29 NOTE — Progress Notes (Signed)
Subjective:    Patient ID: Juan Armstrong, male    DOB: 1956/02/07, 58 y.o.   MRN: [id number]  PCP No PCP Per Patient   HPI  IOV 08/29/2014  Chief Complaint  Patient presents with  . Pulmonary Consult    Pt referred by Dr. Sheldon Silvan for CP with rest and activity.    58 year old male. Accompanied by his daughter who is a Therapist, sports with hospice in [place], [place]. REports chronic decades of left infra-axillary pain and precordial pain. Says origianally started when he was 18 but was mild and rare but past 10 years worse. In 2009 had tonsillar cancer and in the yeaars he was getting chemo and xrt pain was resolved but now past few years pain is back. No clear cut aggravating factors other than stress. Can happen at rest. Non exertional. No specific relieving factors. DEnies this is a muscule spasm though has been told that by other MDs. Has had eval NOS > 5 to 10 years ago at [facility] and [facility] but without cause being found. Has has had cardiac workup - all negative per hx. There is no associated dyspnea, wheeze, edema, pnd, orthopnea. No radiculopathy down fingers. No hx of trauma or C-, T-spine disease. No PE Risk factors . Pain is rated as moderate. Come and goes  Low Dose CT chest 54/20/15; clear lung files except mild bi-apical scarring. No other nodules, or cancer or rib fracture   Offered PFT, d-dimer, - but he wants to think about it; feels testing will be low yield. Just wants to know cause    has a past medical history of Cancer; Tonsillar cancer (2009); and Squamous cell carcinoma of right tonsil (03/13/2014).    has past surgical history that includes Neck dissection (2009) and Tonsillectomy (2009).   Current outpatient prescriptions:ibuprofen (ADVIL,MOTRIN) 200 MG tablet, Take 600-800 mg by mouth every 4 (four) hours as needed for pain or headache. , Disp: , Rfl: ;  ferrous sulfate 325 (65 FE) MG tablet, Take 325 mg by mouth daily with breakfast., Disp: , Rfl: ;  Multiple Vitamins-Minerals  (MULTIVITAMIN PO), Take 1 capsule by mouth daily., Disp: , Rfl: ;  Omega-3 Fatty Acids (FISH OIL) 1000 MG CAPS, Take 2 capsules by mouth daily., Disp: , Rfl:  Red Yeast Rice Extract (RED YEAST RICE PO), Take 2 capsules by mouth daily., Disp: , Rfl:   No Known Allergies   reports that he quit smoking about 2 years ago. His smoking use included Cigarettes. He has a 15 pack-year smoking history. He has never used smokeless tobacco.  History  Substance Use Topics  . Smoking status: Former Smoker -- 0.50 packs/day for 30 years    Types: Cigarettes    Quit date: 05/25/2012  . Smokeless tobacco: Never Used  . Alcohol Use: 2.4 oz/week    1 Glasses of wine, 3 Cans of beer per week     Comment: occas      Review of Systems  Constitutional: Negative for fever and unexpected weight change.  HENT: Negative for congestion, dental problem, ear pain, nosebleeds, postnasal drip, rhinorrhea, sinus pressure, sneezing, sore throat and trouble swallowing.   Eyes: Negative for redness and itching.  Respiratory: Negative for cough, chest tightness, shortness of breath and wheezing.   Cardiovascular: Positive for chest pain. Negative for palpitations and leg swelling.  Gastrointestinal: Negative for nausea and vomiting.  Genitourinary: Negative for dysuria.  Musculoskeletal: Negative for joint swelling.  Skin: Negative for rash.  Neurological: Negative for  headaches.  Hematological: Does not bruise/bleed easily.  Psychiatric/Behavioral: Negative for dysphoric mood. The patient is not nervous/anxious.        Objective:   Physical Exam  Nursing note and vitals reviewed. Constitutional: He is oriented to person, place, and time. He appears well-developed and well-nourished. No distress.  HENT:  Head: Normocephalic and atraumatic.  Right Ear: External ear normal.  Left Ear: External ear normal.  Mouth/Throat: Oropharynx is clear and moist. No oropharyngeal exudate.  Eyes: Conjunctivae and EOM are  normal. Pupils are equal, round, and reactive to light. Right eye exhibits no discharge. Left eye exhibits no discharge. No scleral icterus.  Neck: Normal range of motion. Neck supple. No JVD present. No tracheal deviation present. No thyromegaly present.  Cardiovascular: Normal rate, regular rhythm and intact distal pulses.  Exam reveals no gallop and no friction rub.   No murmur heard. Pulmonary/Chest: Effort normal and breath sounds normal. No respiratory distress. He has no wheezes. He has no rales. He exhibits no tenderness.  No tenderness No trigger points No  Myofascial spasm  Though he reports current ongoing pain I could not elicit any of the above  Abdominal: Soft. Bowel sounds are normal. He exhibits no distension and no mass. There is no tenderness. There is no rebound and no guarding.  Musculoskeletal: Normal range of motion. He exhibits no edema and no tenderness.  Lymphadenopathy:    He has no cervical adenopathy.  Neurological: He is alert and oriented to person, place, and time. He has normal reflexes. No cranial nerve deficit. Coordination normal.  Skin: Skin is warm and dry. No rash noted. He is not diaphoretic. No erythema. No pallor.  Psychiatric: He has a normal mood and affect. His behavior is normal. Judgment and thought content normal.    Filed Vitals:   08/29/14 1556  BP: 130/90  Pulse: 79  Height: 6\' 6"  (1.981 m)  Weight: 229 lb (103.874 kg)  SpO2: 98%          Assessment & Plan:  #Left sided chest pain  --not sure if there is a pulmonary cause for this  - to be on 5000% sure thre is no blood clot, we can do d-dimer blood test for blood clot and get pft breathing test - you can also consider discussing C - and T- spine MRI with your primary care doctor;l sometime spinal pain can get refered - think about these and let me know how you wish to proceed (he wanted to think about all these issues)   #Followup - as needed   Dr. Brand Males, M.D.,  [facility].C.P Pulmonary and Critical Care Medicine Staff Physician [facility] Pager: [phone], If no answer or between  15:00h - 7:00h: call [phone]  08/29/2014 5:19 PM

## 2015-03-15 ENCOUNTER — Ambulatory Visit (HOSPITAL_COMMUNITY): Payer: No Typology Code available for payment source | Admitting: Oncology

## 2015-03-15 ENCOUNTER — Encounter (HOSPITAL_COMMUNITY): Payer: Self-pay | Admitting: Oncology

## 2015-03-15 DIAGNOSIS — Z720 Tobacco use: Secondary | ICD-10-CM

## 2015-03-15 HISTORY — DX: Tobacco use: Z72.0

## 2015-03-15 NOTE — Assessment & Plan Note (Deleted)
History of stage IV B. 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 He underwent surgery consisting of a right neck dissection and a right tonsillar resection and biopsy left nasopharynx, the right nasopharynx at the [facility] by Dr. Melven Sartorius on 03/04/2008. He then went on be treated by Dr. Nila Nephew and Dr. Lynelle Doctor at [place], [place] with combination chemotherapy and radiation therapy, the chemotherapy consisting of cisplatin daily for 5 days with 5-FU for 5 days on weeks #1 and #5. He finished all therapy as of 07/29/2008 thus far remains disease free. He has not been back to [facility] in several years.  Labs today: CBC diff, CMET  Return in 12 months for follow-up.

## 2015-03-15 NOTE — Assessment & Plan Note (Deleted)
Smoking cessation education provided.  [place] Preventative Services Task Force recommend annual screening for lung cancer with low-dose CT in adults aged 57- 49 years who have a 30 pack year smoking history and currently smoke or have quit smoking within the past 15 years.  Screening should be discontinued once a person has not smoked for 15 years or develops a health problem that substantially limits life expectancy or the ability or willingness to have curative lung surgery.  It is a category B recommendation.  Similar stances are provided by CMS, NCCN, and AATS.  Low-dose CT ordered and to be completed in near future.

## 2015-03-15 NOTE — Progress Notes (Signed)
-  Rescheduled-  Juan Armstrong,Juan Armstrong  

## 2015-03-31 ENCOUNTER — Encounter (HOSPITAL_COMMUNITY): Payer: Self-pay | Admitting: Oncology

## 2015-03-31 ENCOUNTER — Encounter (HOSPITAL_COMMUNITY): Payer: No Typology Code available for payment source | Attending: Oncology | Admitting: Oncology

## 2015-03-31 VITALS — BP 130/83 | HR 62 | Temp 98.1°F | Resp 16 | Wt 242.9 lb

## 2015-03-31 DIAGNOSIS — C099 Malignant neoplasm of tonsil, unspecified: Secondary | ICD-10-CM | POA: Diagnosis not present

## 2015-03-31 DIAGNOSIS — C770 Secondary and unspecified malignant neoplasm of lymph nodes of head, face and neck: Secondary | ICD-10-CM

## 2015-03-31 DIAGNOSIS — Z720 Tobacco use: Secondary | ICD-10-CM | POA: Diagnosis not present

## 2015-03-31 LAB — CBC WITH DIFFERENTIAL/PLATELET
BASOS ABS: 0 10*3/uL (ref 0.0–0.1)
BASOS PCT: 1 % (ref 0–1)
EOS ABS: 0.2 10*3/uL (ref 0.0–0.7)
Eosinophils Relative: 7 % — ABNORMAL HIGH (ref 0–5)
HCT: 43.7 % (ref 39.0–52.0)
Hemoglobin: 14.9 g/dL (ref 13.0–17.0)
Lymphocytes Relative: 25 % (ref 12–46)
Lymphs Abs: 0.9 10*3/uL (ref 0.7–4.0)
MCH: 32.7 pg (ref 26.0–34.0)
MCHC: 34.1 g/dL (ref 30.0–36.0)
MCV: 96 fL (ref 78.0–100.0)
MONO ABS: 0.3 10*3/uL (ref 0.1–1.0)
Monocytes Relative: 9 % (ref 3–12)
Neutro Abs: 2.1 10*3/uL (ref 1.7–7.7)
Neutrophils Relative %: 58 % (ref 43–77)
Platelets: 186 10*3/uL (ref 150–400)
RBC: 4.55 MIL/uL (ref 4.22–5.81)
RDW: 12.7 % (ref 11.5–15.5)
WBC: 3.5 10*3/uL — ABNORMAL LOW (ref 4.0–10.5)

## 2015-03-31 LAB — COMPREHENSIVE METABOLIC PANEL
ALT: 44 U/L (ref 17–63)
AST: 30 U/L (ref 15–41)
Albumin: 4.6 g/dL (ref 3.5–5.0)
Alkaline Phosphatase: 64 U/L (ref 38–126)
Anion gap: 8 (ref 5–15)
BUN: 18 mg/dL (ref 6–20)
CALCIUM: 9.5 mg/dL (ref 8.9–10.3)
CO2: 25 mmol/L (ref 22–32)
CREATININE: 0.88 mg/dL (ref 0.61–1.24)
Chloride: 102 mmol/L (ref 101–111)
GFR calc Af Amer: >60 mL/min (ref >60)
GFR calc non Af Amer: >60 mL/min (ref >60)
Glucose, Bld: 104 mg/dL — ABNORMAL HIGH (ref 70–99)
Potassium: 4.6 mmol/L (ref 3.5–5.1)
Sodium: 135 mmol/L (ref 135–145)
TOTAL PROTEIN: 7.8 g/dL (ref 6.5–8.1)
Total Bilirubin: 0.6 mg/dL (ref 0.3–1.2)

## 2015-03-31 LAB — TSH: TSH: 5.768 u[IU]/mL — ABNORMAL HIGH (ref 0.350–4.500)

## 2015-03-31 NOTE — Patient Instructions (Signed)
[place] at [facility] Discharge Instructions  RECOMMENDATIONS MADE BY THE CONSULTANT AND ANY TEST RESULTS WILL BE SENT TO YOUR REFERRING PHYSICIAN.  Exam and discussion by Robynn Pane, PA-C Will check labs today Get you scheduled for low dose CT of your chest within the next 2 months Call with any concerns.  Follow-up in 12 months.  Thank you for choosing [place] at [facility] to provide your oncology and hematology care.  To afford each patient quality time with our provider, please arrive at least 15 minutes before your scheduled appointment time.    You need to re-schedule your appointment should you arrive 10 or more minutes late.  We strive to give you quality time with our providers, and arriving late affects you and other patients whose appointments are after yours.  Also, if you no show three or more times for appointments you may be dismissed from the clinic at the providers discretion.     Again, thank you for choosing [facility].  Our hope is that these requests will decrease the amount of time that you wait before being seen by our physicians.       _____________________________________________________________  Should you have questions after your visit to [facility], please contact our office at [phone] between the hours of 8:30 a.m. and 4:30 p.m.  Voicemails left after 4:30 p.m. will not be returned until the following business day.  For prescription refill requests, have your pharmacy contact our office.

## 2015-03-31 NOTE — Assessment & Plan Note (Addendum)
History of stage IV B. 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 He underwent surgery consisting of a right neck dissection and a right tonsillar resection and biopsy left nasopharynx, the right nasopharynx at the [facility] by Dr. Melven Sartorius on 03/04/2008. He then went on be treated by Dr. Nila Nephew and Dr. Lynelle Doctor at [place], [place] with combination chemotherapy and radiation therapy, the chemotherapy consisting of cisplatin daily for 5 days with 5-FU for 5 days on weeks #1 and #5. He finished all therapy as of 07/29/2008 thus far remains disease free. He has not been back to [facility] in several years.  Labs today: CBC diff, CMET, TSH.  He needs establishment of primary care provider.    Return in 12 months for follow-up.

## 2015-03-31 NOTE — Progress Notes (Signed)
No PCP Per Patient No address on file  Squamous cell carcinoma of right tonsil - Plan: CBC with Differential, Comprehensive metabolic panel, TSH, CBC with Differential, Comprehensive metabolic panel, TSH  Tobacco abuse  CURRENT THERAPY: Surveillance per NCCN guidelines  INTERVAL HISTORY: Juan Armstrong 59 y.o. male returns for followup of history of stage IV B. 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 He underwent surgery consisting of a right neck dissection and a right tonsillar resection and biopsy left nasopharynx, the right nasopharynx at the [facility] by Dr. Melven Armstrong on 03/04/2008. He then went on be treated by Dr. Nila Armstrong and Dr. Arnell Sieving Armstrong at [place], [place] with combination chemotherapy and radiation therapy, the chemotherapy consisting of cisplatin daily for 5 days with 5-FU for 5 days on weeks #1 and #5. He finished all therapy as of 07/29/2008 thus far remains disease free. He has not been back to [facility] in several years.   Chart reviewed.   I personally reviewed and went over laboratory results with the patient.  The results are noted within this dictation.  We will update labs today.  He reports that he quits smoking 12 months ago.  He still qualifies for low-dose spiral Ct of chest according to the guidelines in addition to his Stage IV head and neck cancer.  We will get that set-up for him within the next two months.   He has not yet established himself with a primary care provider and I recommended he do so.  His daughter called the clinic to let us know that she thinks the patient is having memory issues.  I will defer that to a primary care provider when he establishes himself with that care.  Oncologically, he denies any complaints and ROS questioning is negative.  Past Medical History  Diagnosis Date  .  Cancer   . Tonsillar cancer 2009  . Squamous cell carcinoma of right tonsil 03/13/2014  . Tobacco abuse 03/15/2015    has Chronic chest wall pain; Squamous cell carcinoma of right tonsil; and Tobacco abuse on his problem list.     has No Known Allergies.  Juan Armstrong had no medications administered during this visit.  Past Surgical History  Procedure Laterality Date  . Neck dissection  2009    rt  . Tonsillectomy  2009    Denies any headaches, dizziness, double vision, fevers, chills, night sweats, nausea, vomiting, diarrhea, constipation, chest pain, heart palpitations, shortness of breath, blood in stool, black tarry stool, urinary pain, urinary burning, urinary frequency, hematuria.   PHYSICAL EXAMINATION  ECOG PERFORMANCE STATUS: 0 - Asymptomatic  Filed Vitals:   03/31/15 1043  BP: 130/83  Pulse: 62  Temp: 98.1 F (36.7 C)  Resp: 16    GENERAL:alert, healthy, no distress, well nourished, well developed, comfortable, cooperative and smiling SKIN: skin color, texture, turgor are normal, no rashes or significant lesions HEAD: Normocephalic, No masses, lesions, tenderness or abnormalities EYES: normal, PERRLA, EOMI, Conjunctiva are pink and non-injected EARS: External ears normal OROPHARYNX:no exudate, no erythema, lips, buccal mucosa, and tongue normal and mucous membranes are moist  NECK: supple, no adenopathy, thyroid normal size, non-tender, without nodularity, no stridor, non-tender, trachea midline LYMPH:  no palpable lymphadenopathy, no hepatosplenomegaly BREAST:breasts appear normal, no suspicious masses, no skin or nipple changes or axillary nodes LUNGS: clear to auscultation and percussion,  improved air movement HEART: regular rate & rhythm, no murmurs, no gallops, S1 normal and S2 normal ABDOMEN:abdomen soft, non-tender, normal bowel sounds, no masses or organomegaly and no hepatosplenomegaly BACK: Back symmetric, no curvature., No CVA  tenderness EXTREMITIES:less then 2 second capillary refill, no joint deformities, effusion, or inflammation, no edema, no skin discoloration, no clubbing, no cyanosis  NEURO: alert & oriented x 3 with fluent speech, no focal motor/sensory deficits, gait normal   LABORATORY DATA: CBC    Component Value Date/Time   WBC 3.5* 03/31/2015 1109   RBC 4.55 03/31/2015 1109   HGB 14.9 03/31/2015 1109   HCT 43.7 03/31/2015 1109   PLT 186 03/31/2015 1109   MCV 96.0 03/31/2015 1109   MCH 32.7 03/31/2015 1109   MCHC 34.1 03/31/2015 1109   RDW 12.7 03/31/2015 1109   LYMPHSABS 0.9 03/31/2015 1109   MONOABS 0.3 03/31/2015 1109   EOSABS 0.2 03/31/2015 1109   BASOSABS 0.0 03/31/2015 1109      Chemistry      Component Value Date/Time   NA 140 03/24/2014 0823   K 4.6 03/24/2014 0823   CL 102 03/24/2014 0823   CO2 28 03/24/2014 0823   BUN 17 03/24/2014 0823   CREATININE 0.98 03/24/2014 0823      Component Value Date/Time   CALCIUM 9.7 03/24/2014 0823   ALKPHOS 64 03/24/2014 0823   AST 26 03/24/2014 0823   ALT 31 03/24/2014 0823   BILITOT 0.2* 03/24/2014 0823        ASSESSMENT AND PLAN:  Squamous cell carcinoma of right tonsil History of stage IV B. 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 He underwent surgery consisting of a right neck dissection and a right tonsillar resection and biopsy left nasopharynx, the right nasopharynx at the [facility] by Dr. Melven Armstrong on 03/04/2008. He then went on be treated by Dr. Nila Armstrong and Dr. Lynelle Armstrong at [place], [place] with combination chemotherapy and radiation therapy, the chemotherapy consisting of cisplatin daily for 5 days with 5-FU for 5 days on weeks #1 and #5. He finished all therapy as of 07/29/2008 thus far remains disease free. He has not been back to [facility] in several years.  Labs today:  CBC diff, CMET, TSH.  He needs establishment of primary care provider.    Return in 12 months for follow-up.   Tobacco abuse Juan Armstrong quit smoking 12 months ago he reports.  [place] Preventative Services Task Force recommend annual screening for lung cancer with low-dose CT in adults aged 8- 49 years who have a 30 pack year smoking history and currently smoke or have quit smoking within the past 15 years.  Screening should be discontinued once a person has not smoked for 15 years or develops a health problem that substantially limits life expectancy or the ability or willingness to have curative lung surgery.  It is a category B recommendation.  Similar stances are provided by CMS, NCCN, and AATS.  The patient is 59 years old, has 36 pack year smoking history, and quit 12 months ago.  Low-dose CT ordered and to be completed in near future.   THERAPY PLAN:  NCCN guidelines for surveillance of Head and Neck cancer recommends:  A. H+P every 1-3 months for year 1  B. H+P every 2-6 months for year 2  C. H+P every 4-8 months for years 3-5  D.  H+P every year for years greater than 5  E. Imaging only as clinically indicated following baseline imaging study within 6 months of completion of therapy.   F.Complete head and neck exam; mirror and fiberoptic examination as clinically indicated.  G. TSH every 6-12 months.  H. Chest imaging as clinically indicated for patients with smoking history  I. Speech/hearing and swallowing evaluation and rehabilitation as clinically indicated.  J. Smoking cessation and EtOH counseling as clinically indicated.  K. Dental evaluation: recommended for oral cavity and sites exposed to significant intraoral radiation treatment.  L. Consider EBV DNA monitoring for nasopharyngeal cancer.    All questions were answered. The patient knows to call the clinic with any problems, questions or concerns. We can certainly see the patient much sooner if necessary.  Patient and  plan discussed with Dr. Ancil Linsey and she is in agreement with the aforementioned.   This note is electronically signed by: Robynn Pane 03/31/2015 11:36 AM

## 2015-03-31 NOTE — Assessment & Plan Note (Addendum)
Juan Armstrong quit smoking 12 months ago he reports.  [place] Preventative Services Task Force recommend annual screening for lung cancer with low-dose CT in adults aged 57- 31 years who have a 30 pack year smoking history and currently smoke or have quit smoking within the past 15 years.  Screening should be discontinued once a person has not smoked for 15 years or develops a health problem that substantially limits life expectancy or the ability or willingness to have curative lung surgery.  It is a category B recommendation.  Similar stances are provided by CMS, NCCN, and AATS.  The patient is 59 years old, has 36 pack year smoking history, and quit 12 months ago.  Low-dose CT ordered and to be completed in near future.

## 2015-05-10 ENCOUNTER — Ambulatory Visit (HOSPITAL_COMMUNITY)
Admission: RE | Admit: 2015-05-10 | Discharge: 2015-05-10 | Disposition: A | Discharge status: Home / Self Care | Payer: No Typology Code available for payment source | Source: Ambulatory Visit | Attending: Oncology | Admitting: Oncology

## 2015-05-10 ENCOUNTER — Other Ambulatory Visit (HOSPITAL_COMMUNITY): Payer: Self-pay | Admitting: Oncology

## 2015-05-10 DIAGNOSIS — Z87891 Personal history of nicotine dependence: Secondary | ICD-10-CM | POA: Insufficient documentation

## 2015-05-10 DIAGNOSIS — Z122 Encounter for screening for malignant neoplasm of respiratory organs: Secondary | ICD-10-CM | POA: Diagnosis present

## 2015-05-10 DIAGNOSIS — Z720 Tobacco use: Secondary | ICD-10-CM

## 2015-05-10 DIAGNOSIS — C099 Malignant neoplasm of tonsil, unspecified: Secondary | ICD-10-CM

## 2015-05-15 ENCOUNTER — Telehealth (HOSPITAL_COMMUNITY): Payer: Self-pay | Admitting: Emergency Medicine

## 2015-05-15 NOTE — Telephone Encounter (Signed)
Called to notify pt that CT scan was negative

## 2015-05-15 NOTE — Telephone Encounter (Signed)
-----   Message from [facility], PA-C sent at 05/15/2015  9:28 AM EDT ----- Negative.  Will set-up his next screening CT of chest when he returns next year

## 2016-02-13 ENCOUNTER — Other Ambulatory Visit: Payer: Self-pay | Admitting: Acute Care

## 2016-02-13 DIAGNOSIS — Z87891 Personal history of nicotine dependence: Secondary | ICD-10-CM

## 2016-03-29 ENCOUNTER — Ambulatory Visit (HOSPITAL_COMMUNITY): Payer: No Typology Code available for payment source | Admitting: Hematology & Oncology

## 2016-03-29 NOTE — Progress Notes (Signed)
This encounter was created in error - please disregard.

## 2016-06-14 IMAGING — CT CT CHEST LUNG CANCER SCREENING LOW DOSE W/O CM
1 series · 10 of 10 positions shown, 13 images · non-contrast
Comparison: 03/14/2014.

CLINICAL DATA: Subsequent encounter for lung cancer screening and
58-year-old male with 36 pack-year history of smoking.

EXAM:
CT CHEST WITHOUT CONTRAST
TECHNIQUE: Multidetector CT imaging of the chest was performed following the
standard protocol without IV contrast.

[ct lung segmentation data · axial · 0.79mm/px · z∈[-448,-448]mm · 10 of 346 frames shown]
[frame 1/346  mediastinal]
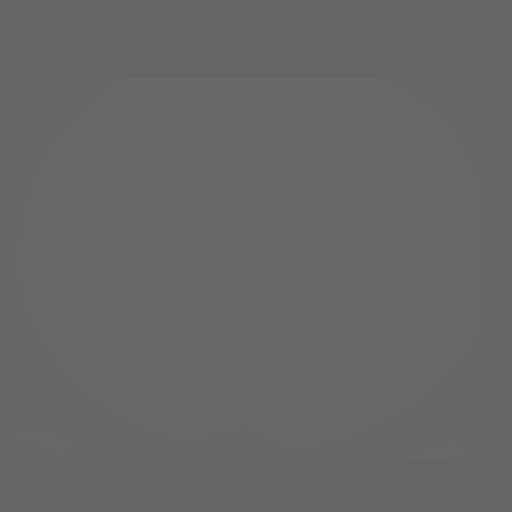
[frame 1/346  lung]
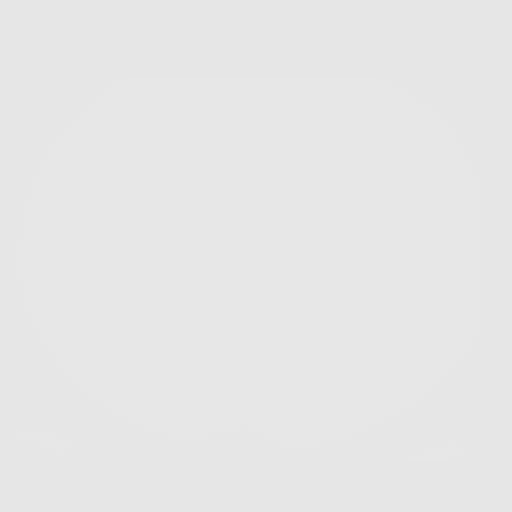
[frame 39/346  lung]
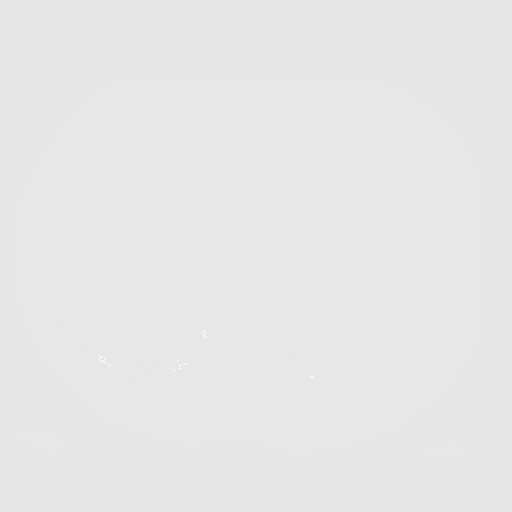
[frame 77/346  lung]
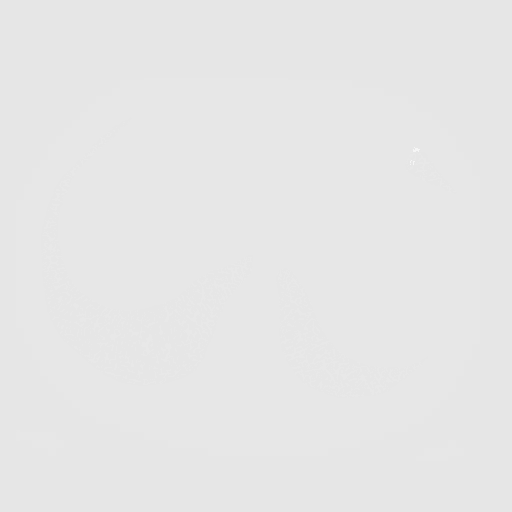
[frame 116/346  lung]
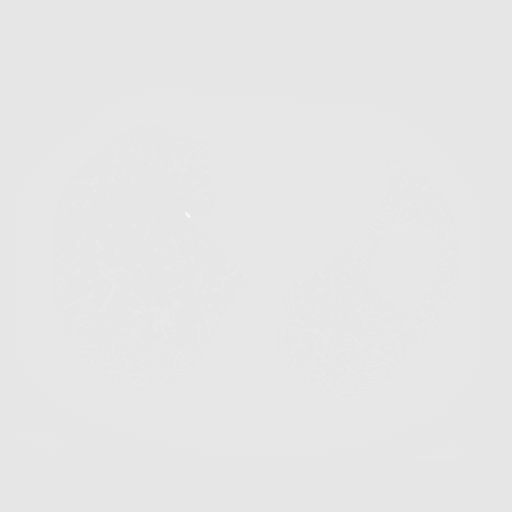
[frame 154/346  mediastinal]
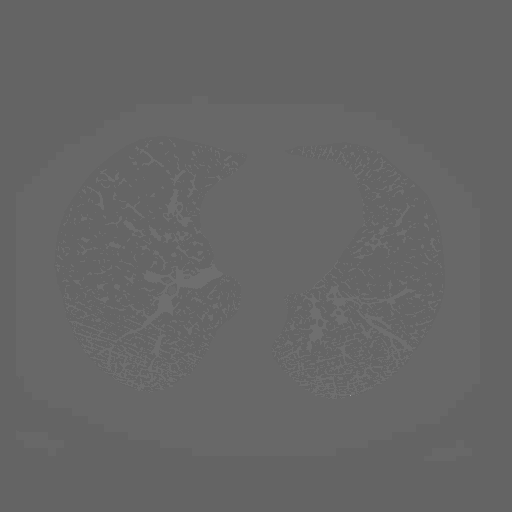
[frame 154/346  lung]
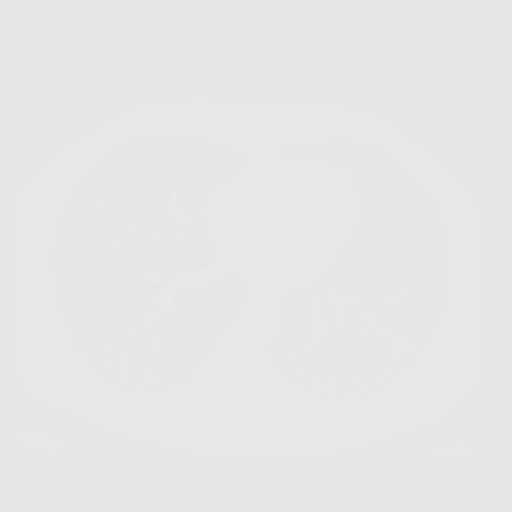
[frame 192/346  lung]
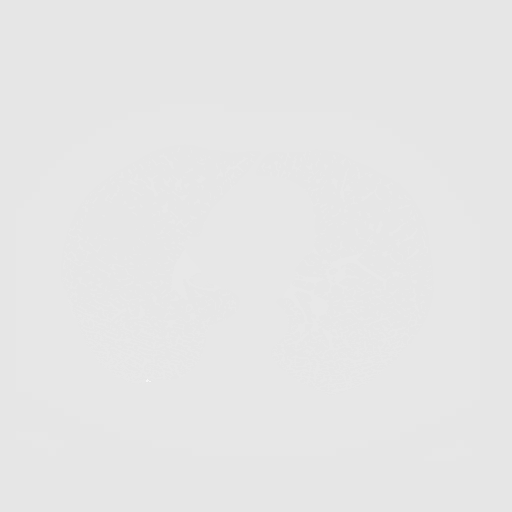
[frame 231/346  lung]
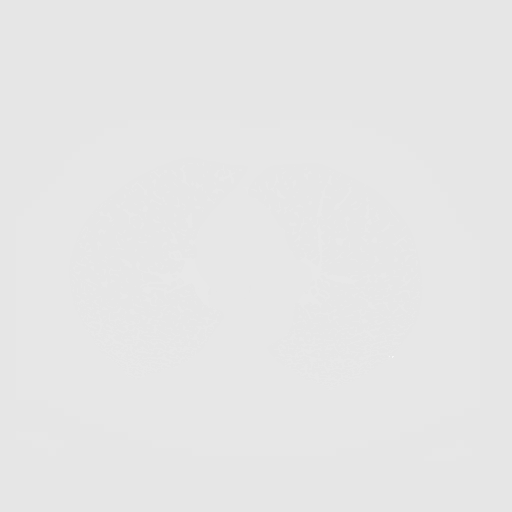
[frame 269/346  lung]
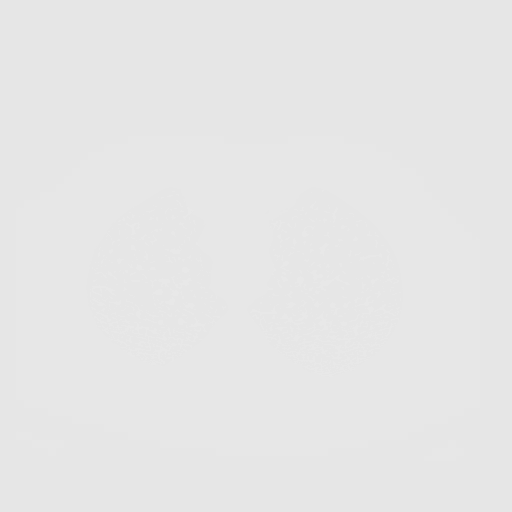
[frame 307/346  mediastinal]
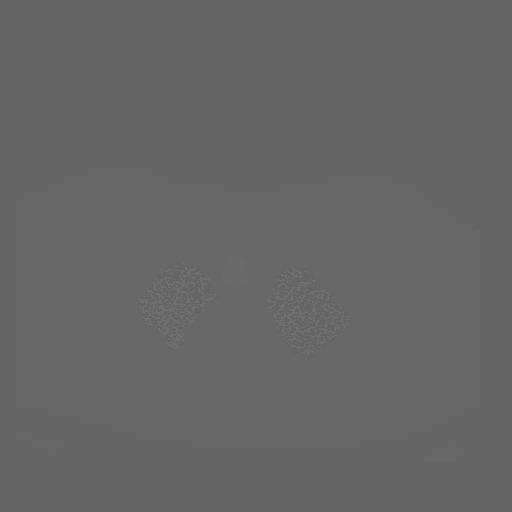
[frame 307/346  lung]
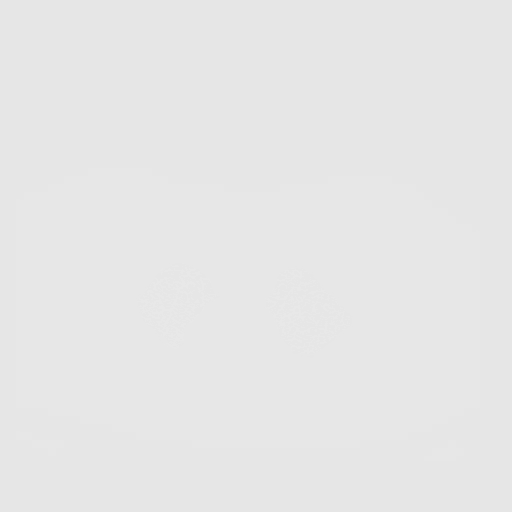
[frame 346/346  lung]
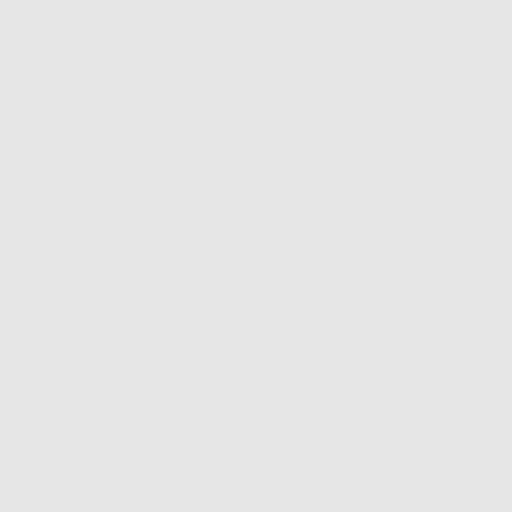

[10 of 10 positions shown; findings below may reference images not displayed]

FINDINGS: Mediastinum / Lymph Nodes: There is no axillary lymphadenopathy. No
mediastinal or hilar lymphadenopathy. Esophagus is unremarkable.
Heart size is normal. No pericardial effusion.

Lungs / Pleura: There is no suspicious parenchymal nodule or mass.
Biapical pleural parenchymal scarring is stable. A densely calcified
granuloma seen in the right upper lobe on image 104 of series 3. No
focal airspace consolidation or pulmonary edema. Platelike area of
chronic atelectasis or scarring in the left lower lobe has
progressed minimally in the interval.

Chronic bronchial wall thickening is evident. Centrilobular
emphysema is noted bilaterally, more advanced in the upper lobes.

[HOSPITAL] / Soft Tissues: Bone windows reveal no worrisome lytic or
sclerotic osseous lesions.

Upper Abdomen:  Unremarkable.
IMPRESSION: Lung-RADS Category 1, negative. Continue annual screening with
low-dose chest CT without contrast in 12 months.

Emphysema. ([id number])

## 2018-08-13 ENCOUNTER — Encounter: Attending: Urology

## 2018-08-17 ENCOUNTER — Ambulatory Visit: Admit: 2018-08-17 | Discharge: 2018-08-17 | Attending: Urology

## 2018-08-17 ENCOUNTER — Ambulatory Visit: Attending: Urology

## 2018-08-17 DIAGNOSIS — N3000 Acute cystitis without hematuria: Secondary | ICD-10-CM

## 2018-08-17 LAB — AMB POC URINALYSIS DIP STICK AUTO W/ MICRO (MICRO RESULTS)
Bilirubin (UA POC): NEGATIVE
Bilirubin, Urine, POC: NEGATIVE
Blood (UA POC): NEGATIVE
Blood (UA POC): NEGATIVE
Crystals (UA POC): NEGATIVE
Crystals (UA POC): NEGATIVE
Epi Cells Urine, POC: 0 NA
Epithelial cells (UA POC): 0
Glucose (UA POC): NEGATIVE
Glucose, Urine, POC: NEGATIVE
Ketones (UA POC): NEGATIVE
Ketones, Urine, POC: NEGATIVE
Leukocyte Esterase, Urine, POC: NEGATIVE
Leukocyte esterase (UA POC): NEGATIVE
Nitrite, Urine, POC: NEGATIVE
Nitrites (UA POC): NEGATIVE
Protein (UA POC): NEGATIVE
Protein, Urine, POC: NEGATIVE
RBC, Urine, POC: 0
RBCs (UA POC): 0
Specific Gravity, Urine, POC: 1.03 NA (ref 1.001–1.035)
Specific gravity (UA POC): 1.03 (ref 1.001–1.035)
Urobilinogen (UA POC): 0.2 (ref 0.2–1)
Urobilinogen, POC: 0.2 (ref 0.2–1)
WBC, Urine, POC: 0
WBCs (UA POC): 0
pH (UA POC): 5.5 (ref 4.6–8.0)
pH, Urine, POC: 5.5 NA (ref 4.6–8.0)

## 2018-08-17 LAB — AMB POC PVR, MEAS,POST-VOID RES,US,NON-IMAGING
PVR POC: 10 cc
PVR: 10 cc

## 2018-08-17 MED ORDER — TAMSULOSIN SR 0.4 MG 24 HR CAP
0.4 mg | ORAL_CAPSULE | Freq: Every evening | ORAL | 12 refills | Status: DC
Start: 2018-08-17 — End: 2018-10-29

## 2018-08-17 NOTE — Progress Notes (Signed)
Get in this week for elevated psa.

## 2018-08-17 NOTE — Progress Notes (Signed)
 Steve Shaffer presents today for lab draw per Dr. Jayme order.   Dr. Jayme was present in the clinic as incident to.     PSA obtained via venipuncture without any difficulty.    Patient will be notified with lab results.       Orders Placed This Encounter   . PROSTATE SPECIFIC AG (PSA)     Standing Status:   Future     Number of Occurrences:   1     Standing Expiration Date:   02/15/2019   . CUSTOMER REQUEST URINE CULTURE     Standing Status:   Future     Standing Expiration Date:   02/15/2019     Scheduling Instructions:      MDLABS   . AMB POC URINALYSIS DIP STICK AUTO W/ MICRO (MICRO RESULTS)   . AMB POC PVR, MEAS,POST-VOID RES,US ,NON-IMAGING   . COLLECTION VENOUS BLOOD,VENIPUNCTURE   . tamsulosin (FLOMAX) 0.4 mg capsule     Sig: Take 1 Cap by mouth nightly.     Dispense:  30 Cap     Refill:  12       [employer], LPN

## 2018-08-17 NOTE — Progress Notes (Signed)
Get in this week for elevated psa.

## 2018-08-17 NOTE — Progress Notes (Signed)
Progress Notes by Estelle June, DO at 08/17/18 1400                Author: Estelle June, DO  Service: --  Author Type: Physician       Filed: 08/18/18 1250  Encounter Date: 08/17/2018  Status: Signed          Editor: Estelle June, DO (Physician)                          HISTORY OF PRESENT ILLNESS:  CARDEN TEEL  is a 62 y.o. male who is seen  in consultation as referred by ER for urgency and decreased amount of urine. Bladder scan only 146. Had fever in ER and was given Cipro, pyridium, and Flomax. Culture positive. Cr 1.2. CT shows BPH and shotty periportal lymphadenopathy. Baseline nocturia  x2. Increased to x10+ during this episode. Since starting Flomax nocturia x1. No personal or family hx of prostate cancer.         AUA Symptom Score  08/17/2018        Over the past month how often have you had the sensation that your bladder was not completely empty after you finished urinating?  1     Over the past month, how often have had to urinate again less than 2 hours after you last finished urinating?  1     Over the past month, how often have you found you stopped and started again several times when you urinated?  1     Over the past month, how often have you found it difficult to postpone urination?  0     Over the past month, how often have you had a weak urinary stream?  1     Over the past month, how often have you had to push or strain to begin urinating?  1     Over the past month, how many times did you most typically get up to urinate from the time you went to bed at night until the time you got up in  the morning?  2     AUA Score  7        If you were to spend the rest of your life with your urinary condition the way it is now, how would you feel about that?  Pleased             Past Medical History:        Diagnosis  Date         ?  Elevated liver enzymes       ?  History of cancer tonsil       ?  Hypercholesterolemia       ?  Hypertension       ?  Tonsil cancer (HCC)           ?   UTI (urinary tract infection)               Past Surgical History:         Procedure  Laterality  Date          ?  HX TONSILLECTOMY              ?  HX TONSILLECTOMY                 Social History          Tobacco Use         ?  Smoking status:  Former Smoker              Last attempt to quit:  2016         Years since quitting:  3.7         ?  Smokeless tobacco:  Never Used       Substance Use Topics         ?  Alcohol use:  Yes              Frequency:  2-3 times a week         ?  Drug use:  Never        No Known Allergies        Family History       Adopted: Yes       Family history unknown: Yes             Current Outpatient Medications          Medication  Sig  Dispense  Refill           ?  albuterol (PROVENTIL HFA, VENTOLIN HFA, PROAIR HFA) 90 mcg/actuation inhaler  inhale ONE TO TWO puffs BY MOUTH EVERY 4 HOURS AS NEEDED    0     ?  ibuprofen (MOTRIN) 200 mg tablet  Take 600-800 mg by mouth.         ?  tamsulosin (FLOMAX) 0.4 mg capsule  Take 1 Cap by mouth nightly.  30 Cap  12     ?  omega 3-dha-epa-fish oil (FISH OIL) 100-160-1,000 mg cap  Take 2 Tabs by mouth.         ?  amoxicillin-clavulanate (AUGMENTIN) 875-125 mg per tablet  TAKE ONE TABLET BY MOUTH TWICE DAILY with food    0     ?  ferrous sulfate 325 mg (65 mg iron) tablet  Take 325 mg by mouth.         ?  omega-3 acid ethyl esters (LOVAZA) 1 gram capsule  Take 2 Caps by mouth.         ?  predniSONE (DELTASONE) 20 mg tablet  TAKE THREE TABLETS BY MOUTH ONCE DAILY with food    0           ?  rosuvastatin (CRESTOR) 10 mg tablet  Take  by mouth.               Review of Systems   Constitutional: Fever: No   Skin: Rash: No   HEENT: Hearing difficulty: No   Eyes: Blurred vision: No   Cardiovascular: Chest pain: No   Respiratory: Shortness of breath: No   Gastrointestinal: Nausea/vomiting: No   Musculoskeletal: Back pain: No   Neurological: Weakness: No   Psychological: Memory loss: No   Comments/additional findings:          PHYSICAL EXAMINATION:    Visit  Vitals      BP  135/89     Pulse  79     Temp  97.2 ??F (36.2 ??C)     Resp  18     Ht  6\' 6"  (1.981 m)     Wt  229 lb (103.9 kg)        BMI  26.46 kg/m??        Constitutional: WDWN, Pleasant and appropriate affect . No acute distress.     CV: No peripheral swelling noted.   Respiratory: No respiratory distress or difficulties.   Abdomen: No  abdominal masses or tenderness. No CVA tenderness. No inguinal  hernias noted.    GU Male:     DRE: Perineum normal to visual inspection, no  erythema or irritation. Sphincter with good tone.  Rectum with no hemorrhoids, fissures or masses. Prostate smooth, symmetric and anodular.  Prostate is moderate.   SCROTUM:  No scrotal rash or lesions noticed.  Normal bilateral testes and epididymis.    PENIS: Urethral meatus normal in location and size. No urethral discharge .   Skin: No jaundice.     Neuro/Psych:  Alert and o riented x 3, affect appropriate.    Lymphatic:   No enlarged inguinal lymph nodes.           REVIEW OF LABS AND IMAGING:       Results for orders placed or performed in visit on 08/17/18     PSA, DIAGNOSTIC (PROSTATE SPECIFIC AG)         Result  Value  Ref Range            Prostate Specific Ag  6.3 (H)  0.0 - 4.0 ng/mL       AMB POC URINALYSIS DIP STICK AUTO W/ MICRO (MICRO RESULTS)         Result  Value  Ref Range            Color (UA POC)  Yellow         Clarity (UA POC)  Clear         Glucose (UA POC)  Negative  Negative       Bilirubin (UA POC)  Negative  Negative       Ketones (UA POC)  Negative  Negative       Specific gravity (UA POC)  1.030  1.001 - 1.035       Blood (UA POC)  Negative  Negative       pH (UA POC)  5.5  4.6 - 8.0       Protein (UA POC)  Negative  Negative       Urobilinogen (UA POC)  0.2 mg/dL  0.2 - 1       Nitrites (UA POC)  Negative  Negative       Leukocyte esterase (UA POC)  Negative  Negative       Epithelial cells (UA POC)  0         WBCs (UA POC)  0          RBCs (UA POC)  0          Bacteria (UA POC)  None  Negative       Crystals (UA  POC)  Negative  Negative       Other (UA POC)           AMB POC PVR, MEAS,POST-VOID RES,US,NON-IMAGING         Result  Value  Ref Range            PVR  10  cc           ASSESSMENT:              ICD-10-CM  ICD-9-CM             1.  Acute cystitis without hematuria  N30.00  595.0  AMB POC URINALYSIS DIP STICK AUTO W/ MICRO (MICRO RESULTS)                CUSTOMER REQUEST URINE CULTURE  2.  Benign prostatic hyperplasia with nocturia  N40.1  600.01  COLLECTION VENOUS BLOOD,VENIPUNCTURE            R35.1  788.43  PSA, DIAGNOSTIC (PROSTATE SPECIFIC AG)           PSA, DIAGNOSTIC (PROSTATE SPECIFIC AG)                AMB POC PVR, MEAS,POST-VOID RES,US,NON-IMAGING           PLAN:     ??  PSA drawn today   ??  Cont Flomax   ??  F/u 3 months to reassesss   ??  Refer to GI for CT findings      Patient's BMI is out of the normal parameters. Information about BMI was given and patient was advised to follow-up with their PCP for further management.        Chief Complaint       Patient presents with        ?  Urinary Retention        Medical documentation provided with the assistance of Isac SarnaKimber N Moore, medical  scribe for [employer] LifeChristi Dimple Bastyr, DO, [address]FACOS       Shallyn Constancio LyonsH[place]art, [place]

## 2018-08-17 NOTE — Progress Notes (Signed)
HISTORY OF PRESENT ILLNESS:  Steve Shaffer is a 61 y.o. male who is seen in consultation as referred by ER for urgency and decreased amount of urine. Bladder scan only 146. Had fever in ER and was given Cipro, pyridium, and Flomax. Culture positive. Cr 1.2. CT shows BPH and shotty periportal lymphadenopathy. Baseline nocturia x2. Increased to x10+ during this episode. Since starting Flomax nocturia x1. No personal or family hx of prostate cancer.    AUA Symptom Score 08/17/2018   Over the past month how often have you had the sensation that your bladder was not completely empty after you finished urinating? 1   Over the past month, how often have had to urinate again less than 2 hours after you last finished urinating? 1   Over the past month, how often have you found you stopped and started again several times when you urinated? 1   Over the past month, how often have you found it difficult to postpone urination? 0   Over the past month, how often have you had a weak urinary stream? 1   Over the past month, how often have you had to push or strain to begin urinating? 1   Over the past month, how many times did you most typically get up to urinate from the time you went to bed at night until the time you got up in the morning? 2   AUA Score 7   If you were to spend the rest of your life with your urinary condition the way it is now, how would you feel about that? Pleased       Past Medical History:   Diagnosis Date   ??? Elevated liver enzymes    ??? History of cancer tonsil    ??? Hypercholesterolemia    ??? Hypertension    ??? Tonsil cancer (HCC)    ??? UTI (urinary tract infection)        Past Surgical History:   Procedure Laterality Date   ??? HX TONSILLECTOMY     ??? HX TONSILLECTOMY         Social History     Tobacco Use   ??? Smoking status: Former Smoker     Last attempt to quit: 2016     Years since quitting: 3.7   ??? Smokeless tobacco: Never Used   Substance Use Topics   ??? Alcohol use: Yes      Frequency: 2-3 times a week   ??? Drug use: Never     No Known Allergies    Family History   Adopted: Yes   Family history unknown: Yes       Current Outpatient Medications   Medication Sig Dispense Refill   ??? albuterol (PROVENTIL HFA, VENTOLIN HFA, PROAIR HFA) 90 mcg/actuation inhaler inhale ONE TO TWO puffs BY MOUTH EVERY 4 HOURS AS NEEDED  0   ??? ibuprofen (MOTRIN) 200 mg tablet Take 600-800 mg by mouth.     ??? tamsulosin (FLOMAX) 0.4 mg capsule Take 1 Cap by mouth nightly. 30 Cap 12   ??? omega 3-dha-epa-fish oil (FISH OIL) 100-160-1,000 mg cap Take 2 Tabs by mouth.     ??? amoxicillin-clavulanate (AUGMENTIN) 875-125 mg per tablet TAKE ONE TABLET BY MOUTH TWICE DAILY with food  0   ??? ferrous sulfate 325 mg (65 mg iron) tablet Take 325 mg by mouth.     ??? omega-3 acid ethyl esters (LOVAZA) 1 gram capsule Take 2 Caps by mouth.     ???  predniSONE (DELTASONE) 20 mg tablet TAKE THREE TABLETS BY MOUTH ONCE DAILY with food  0   ??? rosuvastatin (CRESTOR) 10 mg tablet Take  by mouth.         Review of Systems  Constitutional: Fever: No  Skin: Rash: No  HEENT: Hearing difficulty: No  Eyes: Blurred vision: No  Cardiovascular: Chest pain: No  Respiratory: Shortness of breath: No  Gastrointestinal: Nausea/vomiting: No  Musculoskeletal: Back pain: No  Neurological: Weakness: No  Psychological: Memory loss: No  Comments/additional findings:       PHYSICAL EXAMINATION:   Visit Vitals  BP 135/89   Pulse 79   Temp 97.2 ??F (36.2 ??C)   Resp 18   Ht 6\' 6"  (1.981 m)   Wt 229 lb (103.9 kg)   BMI 26.46 kg/m??     Constitutional: WDWN, Pleasant and appropriate affect. No acute distress.    CV: No peripheral swelling noted.  Respiratory: No respiratory distress or difficulties.  Abdomen: No abdominal masses or tenderness. No CVA tenderness. No inguinal hernias noted.   GU Male:    DRE: Perineum normal to visual inspection, no erythema or irritation. Sphincter with good tone. Rectum with no hemorrhoids, fissures or masses.  Prostate smooth, symmetric and anodular. Prostate is moderate.  SCROTUM:  No scrotal rash or lesions noticed.  Normal bilateral testes and epididymis.   PENIS: Urethral meatus normal in location and size. No urethral discharge.  Skin: No jaundice.    Neuro/Psych:  Alert and oriented x 3, affect appropriate.   Lymphatic:   No enlarged inguinal lymph nodes.        REVIEW OF LABS AND IMAGING:    Results for orders placed or performed in visit on 08/17/18   PSA, DIAGNOSTIC (PROSTATE SPECIFIC AG)   Result Value Ref Range    Prostate Specific Ag 6.3 (H) 0.0 - 4.0 ng/mL   AMB POC URINALYSIS DIP STICK AUTO W/ MICRO (MICRO RESULTS)   Result Value Ref Range    Color (UA POC) Yellow     Clarity (UA POC) Clear     Glucose (UA POC) Negative Negative    Bilirubin (UA POC) Negative Negative    Ketones (UA POC) Negative Negative    Specific gravity (UA POC) 1.030 1.001 - 1.035    Blood (UA POC) Negative Negative    pH (UA POC) 5.5 4.6 - 8.0    Protein (UA POC) Negative Negative    Urobilinogen (UA POC) 0.2 mg/dL 0.2 - 1    Nitrites (UA POC) Negative Negative    Leukocyte esterase (UA POC) Negative Negative    Epithelial cells (UA POC) 0     WBCs (UA POC) 0      RBCs (UA POC) 0      Bacteria (UA POC) None Negative    Crystals (UA POC) Negative Negative    Other (UA POC)     AMB POC PVR, MEAS,POST-VOID RES,US,NON-IMAGING   Result Value Ref Range    PVR 10 cc       ASSESSMENT:     ICD-10-CM ICD-9-CM    1. Acute cystitis without hematuria N30.00 595.0 AMB POC URINALYSIS DIP STICK AUTO W/ MICRO (MICRO RESULTS)      CUSTOMER REQUEST URINE CULTURE   2. Benign prostatic hyperplasia with nocturia N40.1 600.01 COLLECTION VENOUS BLOOD,VENIPUNCTURE    R35.1 788.43 PSA, DIAGNOSTIC (PROSTATE SPECIFIC AG)      PSA, DIAGNOSTIC (PROSTATE SPECIFIC AG)      AMB POC PVR, MEAS,POST-VOID RES,US,NON-IMAGING  PLAN:    ?? PSA drawn today  ?? Cont Flomax  ?? F/u 3 months to reassesss  ?? Refer to GI for CT findings     Patient's BMI is out of the normal parameters. Information about BMI was given and patient was advised to follow-up with their PCP for further management.    Chief Complaint   Patient presents with   ??? Urinary Retention     Medical documentation provided with the assistance of Isac Sarna, medical scribe for [employer], DO, [address] [place], [place]

## 2018-08-17 NOTE — Progress Notes (Signed)
Daxter C Christoph presents today for lab draw per Dr. Hughart order.   Dr. Hughart was present in the clinic as incident to.     PSA obtained via venipuncture without any difficulty.    Patient will be notified with lab results.       Orders Placed This Encounter   ??? PROSTATE SPECIFIC AG (PSA)     Standing Status:   Future     Number of Occurrences:   1     Standing Expiration Date:   02/15/2019   ??? CUSTOMER REQUEST URINE CULTURE     Standing Status:   Future     Standing Expiration Date:   02/15/2019     Scheduling Instructions:      [facility]   ??? AMB POC URINALYSIS DIP STICK AUTO W/ MICRO (MICRO RESULTS)   ??? AMB POC PVR, MEAS,POST-VOID RES,US,NON-IMAGING   ??? COLLECTION VENOUS BLOOD,VENIPUNCTURE   ??? tamsulosin (FLOMAX) 0.4 mg capsule     Sig: Take 1 Cap by mouth nightly.     Dispense:  30 Cap     Refill:  12       Jorgen Wolfinger, LPN

## 2018-08-18 LAB — PSA PROSTATIC SPECIFIC ANTIGEN: PSA: 6.3 ng/mL — ABNORMAL HIGH (ref 0.0–4.0)

## 2018-08-18 LAB — PSA, DIAGNOSTIC (PROSTATE SPECIFIC AG): Prostate Specific Ag: 6.3 ng/mL — ABNORMAL HIGH (ref 0.0–4.0)

## 2018-08-19 ENCOUNTER — Encounter

## 2018-08-20 ENCOUNTER — Ambulatory Visit: Admit: 2018-08-20 | Discharge: 2018-08-20 | Attending: Urology

## 2018-08-20 ENCOUNTER — Ambulatory Visit: Attending: Urology

## 2018-08-20 DIAGNOSIS — R972 Elevated prostate specific antigen [PSA]: Secondary | ICD-10-CM

## 2018-08-20 LAB — AMB POC URINALYSIS DIP STICK AUTO W/ MICRO (MICRO RESULTS)
Bilirubin (UA POC): NEGATIVE
Bilirubin, Urine, POC: NEGATIVE
Blood (UA POC): NEGATIVE
Blood (UA POC): NEGATIVE
Crystals (UA POC): NEGATIVE
Crystals (UA POC): NEGATIVE
Epi Cells Urine, POC: 0 NA
Epithelial cells (UA POC): 0
Glucose (UA POC): NEGATIVE
Glucose, Urine, POC: NEGATIVE
Ketones (UA POC): NEGATIVE
Ketones, Urine, POC: NEGATIVE
Leukocyte Esterase, Urine, POC: NEGATIVE
Leukocyte esterase (UA POC): NEGATIVE
Nitrite, Urine, POC: NEGATIVE
Nitrites (UA POC): NEGATIVE
Protein (UA POC): NEGATIVE
Protein, Urine, POC: NEGATIVE
RBC, Urine, POC: 0
RBCs (UA POC): 0
Specific Gravity, Urine, POC: 1.025 NA (ref 1.001–1.035)
Specific gravity (UA POC): 1.025 (ref 1.001–1.035)
Urobilinogen (UA POC): 0.2 (ref 0.2–1)
Urobilinogen, POC: 0.2 (ref 0.2–1)
WBC, Urine, POC: 0
WBCs (UA POC): 0
pH (UA POC): 6.5 (ref 4.6–8.0)
pH, Urine, POC: 6.5 NA (ref 4.6–8.0)

## 2018-08-20 NOTE — Progress Notes (Signed)
HISTORY OF PRESENT ILLNESS:  Steve Shaffer is a 62 y.o. male who presents today for f/u recent elevated PSA of 6.3. Had presented originally for ER f/u for urgency and feeling of incomplete emptying and found to have UTI. Treated w/ Cipro which improved symptoms. Started on Flomax which decreased nocturia from x2 to x1. Referred to GI last visit for shotty periportal lymphadenopathy - Alk phos was 285, LFT and ALT elevated as well.     AUA Symptom Score 08/20/2018   Over the past month how often have you had the sensation that your bladder was not completely empty after you finished urinating? 1   Over the past month, how often have had to urinate again less than 2 hours after you last finished urinating? 1   Over the past month, how often have you found you stopped and started again several times when you urinated? 1   Over the past month, how often have you found it difficult to postpone urination? 0   Over the past month, how often have you had a weak urinary stream? 1   Over the past month, how often have you had to push or strain to begin urinating? 1   Over the past month, how many times did you most typically get up to urinate from the time you went to bed at night until the time you got up in the morning? 2   AUA Score 7   If you were to spend the rest of your life with your urinary condition the way it is now, how would you feel about that? Pleased       Past Medical History:   Diagnosis Date   ??? Elevated liver enzymes    ??? History of cancer tonsil    ??? Hypercholesterolemia    ??? Hypertension    ??? Tonsil cancer ([place])    ??? UTI (urinary tract infection)        Past Surgical History:   Procedure Laterality Date   ??? HX TONSILLECTOMY     ??? HX TONSILLECTOMY         Social History     Tobacco Use   ??? Smoking status: Former Smoker     Last attempt to quit: 2016     Years since quitting: 3.7   ??? Smokeless tobacco: Never Used   Substance Use Topics   ??? Alcohol use: Yes     Frequency: 2-3 times a week    ??? Drug use: Never     No Known Allergies    Family History   Adopted: Yes   Family history unknown: Yes       Current Outpatient Medications   Medication Sig Dispense Refill   ??? omega 3-dha-epa-fish oil (FISH OIL) 100-160-1,000 mg cap Take 2 Tabs by mouth.     ??? albuterol (PROVENTIL HFA, VENTOLIN HFA, PROAIR HFA) 90 mcg/actuation inhaler inhale ONE TO TWO puffs BY MOUTH EVERY 4 HOURS AS NEEDED  0   ??? amoxicillin-clavulanate (AUGMENTIN) 875-125 mg per tablet TAKE ONE TABLET BY MOUTH TWICE DAILY with food  0   ??? ferrous sulfate 325 mg (65 mg iron) tablet Take 325 mg by mouth.     ??? ibuprofen (MOTRIN) 200 mg tablet Take 600-800 mg by mouth.     ??? omega-3 acid ethyl esters (LOVAZA) 1 gram capsule Take 2 Caps by mouth.     ??? predniSONE (DELTASONE) 20 mg tablet TAKE THREE TABLETS BY MOUTH ONCE DAILY with food  0   ??? rosuvastatin (CRESTOR) 10 mg tablet Take  by mouth.     ??? tamsulosin (FLOMAX) 0.4 mg capsule Take 1 Cap by mouth nightly. 30 Cap 12       REVIEW OF SYSTEMS:  Constitutional: Fever: No  Skin: Rash: No  HEENT: Hearing difficulty: No  Eyes: Blurred vision: No  Cardiovascular: Chest pain: No  Respiratory: Shortness of breath: No  Gastrointestinal: Nausea/vomiting: No  Musculoskeletal: Back pain: No  Neurological: Weakness: No  Psychological: Memory loss: No  Comments/additional findings:       PHYSICAL EXAMINATION:   Visit Vitals  BP 129/80   Pulse 71   Temp 98.5 ??F (36.9 ??C)   Resp 18   Ht '6\' 6"'$  (1.981 m)   Wt 230 lb 9.6 oz (104.6 kg)   BMI 26.65 kg/m??     Constitutional: Well developed, no acute distress.   Eyes: Conjunctiva normal.  Ears: External ear normal.  Nose/Throat: External nose normal.  CV: Heart rate regular. No peripheral swelling noted.  Respiratory: No respiratory distress. No audible wheeze.  Skin: No rash. No ulcer.    Neuro/Psych: Patient with appropriate affect. Alert and oriented x 3.    Gait: Normal.      REVIEW OF LABS AND IMAGING:       Results for orders placed or performed in visit on 08/20/18   CBC W/O DIFF   Result Value Ref Range    WBC 2.8 (L) 3.4 - 10.8 x10E3/uL    RBC 4.27 4.14 - 5.80 x10E6/uL    HGB 13.6 13.0 - 17.7 g/dL    HCT 39.9 37.5 - 51.0 %    MCV 93 79 - 97 fL    MCH 31.9 26.6 - 33.0 pg    MCHC 34.1 31.5 - 35.7 g/dL    RDW 13.5 12.3 - 15.4 %    PLATELET 199 150 - 450 x10E3/uL   AMB POC URINALYSIS DIP STICK AUTO W/ MICRO (MICRO RESULTS)   Result Value Ref Range    Color (UA POC) Yellow     Clarity (UA POC) Clear     Glucose (UA POC) Negative Negative    Bilirubin (UA POC) Negative Negative    Ketones (UA POC) Negative Negative    Specific gravity (UA POC) 1.025 1.001 - 1.035    Blood (UA POC) Negative Negative    pH (UA POC) 6.5 4.6 - 8.0    Protein (UA POC) Negative Negative    Urobilinogen (UA POC) 0.2 mg/dL 0.2 - 1    Nitrites (UA POC) Negative Negative    Leukocyte esterase (UA POC) Negative Negative    Epithelial cells (UA POC) 0     WBCs (UA POC) 0      RBCs (UA POC) 0      Bacteria (UA POC) None Negative    Crystals (UA POC) Negative Negative    Other (UA POC)       PSA /TESTOSTERONE - BSHSI PSA   Latest Ref Rng & Units 0.0 - 4.0 ng/mL   08/17/2018 6.3 (H)       ASSESSMENT:     ICD-10-CM ICD-9-CM    1. Elevated PSA R97.20 790.93 AMB POC URINALYSIS DIP STICK AUTO W/ MICRO (MICRO RESULTS)      COLLECTION VENOUS BLOOD,VENIPUNCTURE      CBC W/O DIFF      CBC W/O DIFF      CUSTOMER REQUEST URINE CULTURE   2. Benign prostatic hyperplasia with nocturia N40.1 600.01  R35.1 788.43    3. Personal history of urinary (tract) infection Z87.440 V13.02         PLAN:    ?? Will see if has had prior PSAs  ?? Cont Flomax  ?? Schedule for in-office prostate biopsy  ?? F/u after w/ pathology  ?? See GI as scheduled    Patient's BMI is out of the normal parameters. Information about BMI was given and patient was advised to follow-up with their PCP for further management.    Chief Complaint   Patient presents with   ??? Elevated PSA      Medical documentation provided with the assistance of Lonell Grandchild, medical scribe for [employer], [place], [place].    Merland Holness [place], DO

## 2018-08-20 NOTE — Progress Notes (Signed)
Steve Shaffer presents today for lab draw per Dr. Hughart order.   Dr. Hughart was present in the clinic as incident to.     CBC obtained via venipuncture without any difficulty.    Patient will be notified with lab results.       Orders Placed This Encounter   ??? CBC W/O DIFF     Standing Status:   Future     Number of Occurrences:   1     Standing Expiration Date:   02/18/2019   ??? CUSTOMER REQUEST URINE CULTURE     Scheduling Instructions:      [facility]   ??? AMB POC URINALYSIS DIP STICK AUTO W/ MICRO (MICRO RESULTS)   ??? COLLECTION VENOUS BLOOD,VENIPUNCTURE       Tiwan Schnitker, LPN

## 2018-08-20 NOTE — Progress Notes (Signed)
 Elspeth JAYSON Piety presents today for lab draw per Dr. Jayme order.   Dr. Jayme was present in the clinic as incident to.     CBC obtained via venipuncture without any difficulty.    Patient will be notified with lab results.       Orders Placed This Encounter   . CBC W/O DIFF     Standing Status:   Future     Number of Occurrences:   1     Standing Expiration Date:   02/18/2019   . CUSTOMER REQUEST URINE CULTURE     Scheduling Instructions:      [facility]   . AMB POC URINALYSIS DIP STICK AUTO W/ MICRO (MICRO RESULTS)   . COLLECTION VENOUS [employer], LPN

## 2018-08-20 NOTE — Progress Notes (Signed)
Progress Notes by Lavone Neri, DO at 08/20/18 0930                Author: Lavone Neri, DO  Service: --  Author Type: Physician       Filed: 08/21/18 1147  Encounter Date: 08/20/2018  Status: Signed          Editor: Lavone Neri, DO (Physician)                          HISTORY OF PRESENT ILLNESS:  Steve Shaffer  is a 62 y.o. male who presents  today for f/u recent elevated PSA of 6.3. Had presented originally for ER f/u for urgency and feeling of incomplete emptying and found to have UTI. Treated w/ Cipro which improved symptoms. Started on Flomax which decreased nocturia from x2 to x1. Referred  to GI last visit for shotty periportal lymphadenopathy - Alk phos was 285, LFT and ALT elevated as well.          AUA Symptom Score  08/20/2018        Over the past month how often have you had the sensation that your bladder was not completely empty after you finished urinating?  1     Over the past month, how often have had to urinate again less than 2 hours after you last finished urinating?  1     Over the past month, how often have you found you stopped and started again several times when you urinated?  1     Over the past month, how often have you found it difficult to postpone urination?  0     Over the past month, how often have you had a weak urinary stream?  1     Over the past month, how often have you had to push or strain to begin urinating?  1     Over the past month, how many times did you most typically get up to urinate from the time you went to bed at night until the time you got up in  the morning?  2     AUA Score  7        If you were to spend the rest of your life with your urinary condition the way it is now, how would you feel about that?  Pleased             Past Medical History:        Diagnosis  Date         ?  Elevated liver enzymes       ?  History of cancer tonsil       ?  Hypercholesterolemia       ?  Hypertension       ?  Tonsil cancer ([place])           ?  UTI (urinary  tract infection)               Past Surgical History:         Procedure  Laterality  Date          ?  HX TONSILLECTOMY              ?  HX TONSILLECTOMY                 Social History          Tobacco Use         ?  Smoking status:  Former Smoker              Last attempt to quit:  2016         Years since quitting:  3.7         ?  Smokeless tobacco:  Never Used       Substance Use Topics         ?  Alcohol use:  Yes              Frequency:  2-3 times a week         ?  Drug use:  Never        No Known Allergies        Family History       Adopted: Yes       Family history unknown: Yes             Current Outpatient Medications          Medication  Sig  Dispense  Refill           ?  omega 3-dha-epa-fish oil (FISH OIL) 100-160-1,000 mg cap  Take 2 Tabs by mouth.         ?  albuterol (PROVENTIL HFA, VENTOLIN HFA, PROAIR HFA) 90 mcg/actuation inhaler  inhale ONE TO TWO puffs BY MOUTH EVERY 4 HOURS AS NEEDED    0     ?  amoxicillin-clavulanate (AUGMENTIN) 875-125 mg per tablet  TAKE ONE TABLET BY MOUTH TWICE DAILY with food    0     ?  ferrous sulfate 325 mg (65 mg iron) tablet  Take 325 mg by mouth.         ?  ibuprofen (MOTRIN) 200 mg tablet  Take 600-800 mg by mouth.         ?  omega-3 acid ethyl esters (LOVAZA) 1 gram capsule  Take 2 Caps by mouth.         ?  predniSONE (DELTASONE) 20 mg tablet  TAKE THREE TABLETS BY MOUTH ONCE DAILY with food    0     ?  rosuvastatin (CRESTOR) 10 mg tablet  Take  by mouth.               ?  tamsulosin (FLOMAX) 0.4 mg capsule  Take 1 Cap by mouth nightly.  30 Cap  12           REVIEW OF SYSTEMS:   Constitutional: Fever: No   Skin: Rash: No   HEENT: Hearing difficulty: No   Eyes: Blurred vision: No   Cardiovascular: Chest pain: No   Respiratory: Shortness of breath: No   Gastrointestinal: Nausea/vomiting: No   Musculoskeletal: Back pain: No   Neurological: Weakness: No   Psychological: Memory loss: No   Comments/additional findings:          PHYSICAL EXAMINATION:    Visit Vitals      BP   129/80     Pulse  71     Temp  98.5 ??F (36.9 ??C)     Resp  18     Ht  6' 6"  (1.981 m)     Wt  230 lb 9.6 oz (104.6 kg)        BMI  26.65 kg/m??        Constitutional: Well developed, no acute distress.    Eyes: Conjunctiva normal.   Ears: External ear normal.   Nose/Throat: External nose normal.   CV: Heart rate  regular.  No peripheral swelling noted.   Respiratory: No respiratory distress. No audible wheeze.   Skin: No rash. No ulcer.      Neuro/Psych: Patient with appropriate affect. Alert and oriented x 3.      Gait: Normal.         REVIEW OF LABS AND IMAGING:          Results for orders placed or performed in visit on 08/20/18     CBC W/O DIFF         Result  Value  Ref Range            WBC  2.8 (L)  3.4 - 10.8 x10E3/uL       RBC  4.27  4.14 - 5.80 x10E6/uL       HGB  13.6  13.0 - 17.7 g/dL       HCT  39.9  37.5 - 51.0 %       MCV  93  79 - 97 fL       MCH  31.9  26.6 - 33.0 pg       MCHC  34.1  31.5 - 35.7 g/dL       RDW  13.5  12.3 - 15.4 %       PLATELET  199  150 - 450 x10E3/uL       AMB POC URINALYSIS DIP STICK AUTO W/ MICRO (MICRO RESULTS)         Result  Value  Ref Range            Color (UA POC)  Yellow         Clarity (UA POC)  Clear         Glucose (UA POC)  Negative  Negative       Bilirubin (UA POC)  Negative  Negative       Ketones (UA POC)  Negative  Negative       Specific gravity (UA POC)  1.025  1.001 - 1.035       Blood (UA POC)  Negative  Negative       pH (UA POC)  6.5  4.6 - 8.0       Protein (UA POC)  Negative  Negative            Urobilinogen (UA POC)  0.2 mg/dL  0.2 - 1            Nitrites (UA POC)  Negative  Negative       Leukocyte esterase (UA POC)  Negative  Negative       Epithelial cells (UA POC)  0         WBCs (UA POC)  0          RBCs (UA POC)  0          Bacteria (UA POC)  None  Negative       Crystals (UA POC)  Negative  Negative            Other (UA POC)               PSA /TESTOSTERONE - BSHSI  PSA     Latest Ref Rng & Units  0.0 - 4.0 ng/mL        08/17/2018  6.3 (H)            ASSESSMENT:              ICD-10-CM  ICD-9-CM  1.  Elevated PSA  R97.20  790.93  AMB POC URINALYSIS DIP STICK AUTO W/ MICRO (MICRO RESULTS)                COLLECTION VENOUS BLOOD,VENIPUNCTURE           CBC W/O DIFF           CBC W/O DIFF           CUSTOMER REQUEST URINE CULTURE           2.  Benign prostatic hyperplasia with nocturia  N40.1  600.01              R35.1  788.43             3.  Personal history of urinary (tract) infection  Z87.440  V13.02              PLAN:     ??  Will see if has had prior PSAs   ??  Cont Flomax   ??  Schedule for in-office prostate biopsy   ??  F/u after w/ pathology   ??  See GI as scheduled      Patient's BMI is out of the normal parameters. Information about BMI was given and patient was advised to follow-up with their PCP for further management.        Chief Complaint       Patient presents with        ?  Elevated PSA        Medical documentation provided with the assistance of Lonell Grandchild, medical  scribe for [employer], DO, [place].      Cynthie Garmon [place], DO

## 2018-08-21 LAB — CBC
Hematocrit: 39.9 % (ref 37.5–51.0)
Hemoglobin: 13.6 g/dL (ref 13.0–17.7)
MCH: 31.9 pg (ref 26.6–33.0)
MCHC: 34.1 g/dL (ref 31.5–35.7)
MCV: 93 fL (ref 79–97)
Platelets: 199 10*3/uL (ref 150–450)
RBC: 4.27 x10E6/uL (ref 4.14–5.80)
RDW: 13.5 % (ref 12.3–15.4)
WBC: 2.8 10*3/uL — ABNORMAL LOW (ref 3.4–10.8)

## 2018-08-21 LAB — CBC W/O DIFF
HCT: 39.9 % (ref 37.5–51.0)
HGB: 13.6 g/dL (ref 13.0–17.7)
MCH: 31.9 pg (ref 26.6–33.0)
MCHC: 34.1 g/dL (ref 31.5–35.7)
MCV: 93 fL (ref 79–97)
PLATELET: 199 10*3/uL (ref 150–450)
RBC: 4.27 x10E6/uL (ref 4.14–5.80)
RDW: 13.5 % (ref 12.3–15.4)
WBC: 2.8 10*3/uL — ABNORMAL LOW (ref 3.4–10.8)

## 2018-08-26 ENCOUNTER — Ambulatory Visit: Admit: 2018-08-26 | Discharge: 2018-08-26 | Attending: Urology

## 2018-08-26 ENCOUNTER — Ambulatory Visit: Attending: Urology

## 2018-08-26 DIAGNOSIS — N50819 Testicular pain, unspecified: Secondary | ICD-10-CM

## 2018-08-26 LAB — AMB POC URINALYSIS DIP STICK AUTO W/ MICRO (MICRO RESULTS)
Bilirubin (UA POC): NEGATIVE
Bilirubin, Urine, POC: NEGATIVE
Crystals (UA POC): NEGATIVE
Crystals (UA POC): NEGATIVE
Glucose (UA POC): NEGATIVE
Glucose, Urine, POC: NEGATIVE
Ketones (UA POC): NEGATIVE
Ketones, Urine, POC: NEGATIVE
Leukocyte Esterase, Urine, POC: NEGATIVE
Leukocyte esterase (UA POC): NEGATIVE
Nitrite, Urine, POC: NEGATIVE
Nitrites (UA POC): NEGATIVE
Protein (UA POC): NEGATIVE
Protein, Urine, POC: NEGATIVE
Specific Gravity, Urine, POC: 1.03 NA (ref 1.001–1.035)
Specific gravity (UA POC): 1.03 (ref 1.001–1.035)
Urobilinogen (UA POC): 0.2 (ref 0.2–1)
Urobilinogen, POC: 0.2 (ref 0.2–1)
pH (UA POC): 5.5 (ref 4.6–8.0)
pH, Urine, POC: 5.5 NA (ref 4.6–8.0)

## 2018-08-26 NOTE — Progress Notes (Signed)
Progress Notes by Estelle June, DO at 08/26/18 1100                Author: Estelle June, DO  Service: --  Author Type: Physician       Filed: 08/26/18 1151  Encounter Date: 08/26/2018  Status: Addendum          Editor: Estelle June, DO (Physician)          Related Notes: Original Note by Estelle June, DO (Physician) filed at 08/26/18 1151                          HISTORY OF PRESENT ILLNESS:  Steve Shaffer  is a 62 y.o. male who presents  today for testicular pain. Had presented originally for ER f/u for urgency and feeling of incomplete emptying and found to have UTI. Treated w/ Cipro which improved symptoms. Started on Flomax which decreased nocturia from x2 to x1.  Has  an elevated  PSA of 6.3 ng/mL. He is scheduled for prostate biopsy 09/03/18. Patient notes bilateral testicular pain.  Pain not worse than prior, is just worried something bad is going on.  Worse with walking at work.  No dysuria/hematuria.               AUA Symptom Score  08/26/2018        Over the past month how often have you had the sensation that your bladder was not completely empty after you finished urinating?  1     Over the past month, how often have had to urinate again less than 2 hours after you last finished urinating?  0     Over the past month, how often have you found you stopped and started again several times when you urinated?  1     Over the past month, how often have you found it difficult to postpone urination?  0     Over the past month, how often have you had a weak urinary stream?  1     Over the past month, how often have you had to push or strain to begin urinating?  1     Over the past month, how many times did you most typically get up to urinate from the time you went to bed at night until the time you got up in  the morning?  2     AUA Score  6        If you were to spend the rest of your life with your urinary condition the way it is now, how would you feel about that?  Delighted              Past Medical History:        Diagnosis  Date         ?  Elevated liver enzymes       ?  History of cancer tonsil       ?  Hypercholesterolemia       ?  Hypertension       ?  Tonsil cancer (HCC)           ?  UTI (urinary tract infection)               Past Surgical History:         Procedure  Laterality  Date          ?  HX TONSILLECTOMY              ?  HX TONSILLECTOMY                 Social History          Tobacco Use         ?  Smoking status:  Former Smoker              Last attempt to quit:  2016         Years since quitting:  3.7         ?  Smokeless tobacco:  Never Used       Substance Use Topics         ?  Alcohol use:  Yes              Frequency:  2-3 times a week         ?  Drug use:  Never        No Known Allergies        Family History       Adopted: Yes       Family history unknown: Yes             Current Outpatient Medications          Medication  Sig  Dispense  Refill           ?  omega 3-dha-epa-fish oil (FISH OIL) 100-160-1,000 mg cap  Take 2 Tabs by mouth.         ?  albuterol (PROVENTIL HFA, VENTOLIN HFA, PROAIR HFA) 90 mcg/actuation inhaler  inhale ONE TO TWO puffs BY MOUTH EVERY 4 HOURS AS NEEDED    0     ?  amoxicillin-clavulanate (AUGMENTIN) 875-125 mg per tablet  TAKE ONE TABLET BY MOUTH TWICE DAILY with food    0     ?  ferrous sulfate 325 mg (65 mg iron) tablet  Take 325 mg by mouth.         ?  ibuprofen (MOTRIN) 200 mg tablet  Take 600-800 mg by mouth.         ?  omega-3 acid ethyl esters (LOVAZA) 1 gram capsule  Take 2 Caps by mouth.         ?  predniSONE (DELTASONE) 20 mg tablet  TAKE THREE TABLETS BY MOUTH ONCE DAILY with food    0     ?  rosuvastatin (CRESTOR) 10 mg tablet  Take  by mouth.               ?  tamsulosin (FLOMAX) 0.4 mg capsule  Take 1 Cap by mouth nightly.  30 Cap  12           REVIEW OF SYSTEMS:   Constitutional: Fever: No   Skin: Rash: No   HEENT: Hearing difficulty: No   Eyes: Blurred vision: No   Cardiovascular: Chest pain: No   Respiratory: Shortness of breath:  No   Gastrointestinal: Nausea/vomiting: No   Musculoskeletal: Back pain: No   Neurological: Weakness: No   Psychological: Memory loss: No   Comments/additional findings:          PHYSICAL EXAMINATION:    Visit Vitals      BP  154/86     Pulse  80     Temp  97.7 ??F (36.5 ??C)     Resp  18     Ht  6\' 6"  (1.981 m)     Wt  228 lb (103.4 kg)        BMI  26.35 kg/m??        Constitutional: Well developed, no acute distress.    Eyes: Conjunctiva normal.   Ears: External ear normal.   Nose/Throat: External nose normal.   CV: Heart rate regular.  No peripheral swelling noted.   Respiratory: No respiratory distress. No audible wheeze.   Skin: No rash. No ulcer.      Neuro/Psych: Patient with appropriate affect. Alert and oriented x 3.      Gait: Normal.   GU:  Normal testes bilaterally, mild pain left epididymis with strong palpation, no indication of infection.         REVIEW OF LABS AND IMAGING:          Results for orders placed or performed in visit on 08/26/18     AMB POC URINALYSIS DIP STICK AUTO W/ MICRO (MICRO RESULTS)         Result  Value  Ref Range            Color (UA POC)           Clarity (UA POC)           Glucose (UA POC)  Negative  Negative       Bilirubin (UA POC)  Negative  Negative       Ketones (UA POC)  Negative  Negative       Specific gravity (UA POC)  1.030  1.001 - 1.035       Blood (UA POC)  Trace  Negative       pH (UA POC)  5.5  4.6 - 8.0       Protein (UA POC)  Negative  Negative       Urobilinogen (UA POC)  0.2 mg/dL  0.2 - 1       Nitrites (UA POC)  Negative  Negative       Leukocyte esterase (UA POC)  Negative  Negative       Epithelial cells (UA POC)           WBCs (UA POC)           RBCs (UA POC)           Bacteria (UA POC)  None  Negative       Crystals (UA POC)  Negative  Negative            Other (UA POC)               PSA /TESTOSTERONE - B[facility]SI  PSA        Latest Ref Rng & Units  0.0 - 4.0 ng/mL        08/17/2018  6.3 (H)           ASSESSMENT:              ICD-10-CM  ICD-9-CM             1.   Testicular pain  N50.819  608.9  AMB POC URINALYSIS DIP STICK AUTO W/ MICRO (MICRO RESULTS)     2.  Elevated PSA  R97.20  790.93       3.  Benign prostatic hyperplasia with nocturia  N40.1  600.01              R35.1  788.43             4.  Personal history of urinary (tract) infection  Z87.440  V13.02              PLAN:     ??  Plan scrotal ultrasound same day as prostate biopsy but no concerning pathology on exam   ??  Will recommend wearing tight underwear and do hernia check next visit   ??  Continue flomax      Patient's BMI is out of the normal parameters. Information about BMI was given and patient was advised to follow-up with their PCP for further management.        Chief Complaint       Patient presents with        ?  Testicle Pain        Medical documentation provided with the assistance of West Carbo, medical  scribe for [facility], DO, [place].      Chaka Boyson [place], DO

## 2018-08-26 NOTE — Progress Notes (Addendum)
HISTORY OF PRESENT ILLNESS:  Steve Shaffer is a 62 y.o. male who presents today for testicular pain. Had presented originally for ER f/u for urgency and feeling of incomplete emptying and found to have UTI. Treated w/ Cipro which improved symptoms. Started on Flomax which decreased nocturia from x2 to x1.  Has  an elevated PSA of 6.3 ng/mL. He is scheduled for prostate biopsy 09/03/18. Patient notes bilateral testicular pain.  Pain not worse than prior, is just worried something bad is going on.  Worse with walking at work.  No dysuria/hematuria.        AUA Symptom Score 08/26/2018   Over the past month how often have you had the sensation that your bladder was not completely empty after you finished urinating? 1   Over the past month, how often have had to urinate again less than 2 hours after you last finished urinating? 0   Over the past month, how often have you found you stopped and started again several times when you urinated? 1   Over the past month, how often have you found it difficult to postpone urination? 0   Over the past month, how often have you had a weak urinary stream? 1   Over the past month, how often have you had to push or strain to begin urinating? 1   Over the past month, how many times did you most typically get up to urinate from the time you went to bed at night until the time you got up in the morning? 2   AUA Score 6   If you were to spend the rest of your life with your urinary condition the way it is now, how would you feel about that? Delighted       Past Medical History:   Diagnosis Date   ??? Elevated liver enzymes    ??? History of cancer tonsil    ??? Hypercholesterolemia    ??? Hypertension    ??? Tonsil cancer (HCC)    ??? UTI (urinary tract infection)        Past Surgical History:   Procedure Laterality Date   ??? HX TONSILLECTOMY     ??? HX TONSILLECTOMY         Social History     Tobacco Use   ??? Smoking status: Former Smoker     Last attempt to quit: 2016      Years since quitting: 3.7   ??? Smokeless tobacco: Never Used   Substance Use Topics   ??? Alcohol use: Yes     Frequency: 2-3 times a week   ??? Drug use: Never     No Known Allergies    Family History   Adopted: Yes   Family history unknown: Yes       Current Outpatient Medications   Medication Sig Dispense Refill   ??? omega 3-dha-epa-fish oil (FISH OIL) 100-160-1,000 mg cap Take 2 Tabs by mouth.     ??? albuterol (PROVENTIL HFA, VENTOLIN HFA, PROAIR HFA) 90 mcg/actuation inhaler inhale ONE TO TWO puffs BY MOUTH EVERY 4 HOURS AS NEEDED  0   ??? amoxicillin-clavulanate (AUGMENTIN) 875-125 mg per tablet TAKE ONE TABLET BY MOUTH TWICE DAILY with food  0   ??? ferrous sulfate 325 mg (65 mg iron) tablet Take 325 mg by mouth.     ??? ibuprofen (MOTRIN) 200 mg tablet Take 600-800 mg by mouth.     ??? omega-3 acid ethyl esters (LOVAZA) 1 gram capsule Take 2  Caps by mouth.     ??? predniSONE (DELTASONE) 20 mg tablet TAKE THREE TABLETS BY MOUTH ONCE DAILY with food  0   ??? rosuvastatin (CRESTOR) 10 mg tablet Take  by mouth.     ??? tamsulosin (FLOMAX) 0.4 mg capsule Take 1 Cap by mouth nightly. 30 Cap 12       REVIEW OF SYSTEMS:  Constitutional: Fever: No  Skin: Rash: No  HEENT: Hearing difficulty: No  Eyes: Blurred vision: No  Cardiovascular: Chest pain: No  Respiratory: Shortness of breath: No  Gastrointestinal: Nausea/vomiting: No  Musculoskeletal: Back pain: No  Neurological: Weakness: No  Psychological: Memory loss: No  Comments/additional findings:       PHYSICAL EXAMINATION:   Visit Vitals  BP 154/86   Pulse 80   Temp 97.7 ??F (36.5 ??C)   Resp 18   Ht 6\' 6"  (1.981 m)   Wt 228 lb (103.4 kg)   BMI 26.35 kg/m??     Constitutional: Well developed, no acute distress.   Eyes: Conjunctiva normal.  Ears: External ear normal.  Nose/Throat: External nose normal.  CV: Heart rate regular. No peripheral swelling noted.  Respiratory: No respiratory distress. No audible wheeze.  Skin: No rash. No ulcer.     Neuro/Psych: Patient with appropriate affect. Alert and oriented x 3.    Gait: Normal.  GU:  Normal testes bilaterally, mild pain left epididymis with strong palpation, no indication of infection.      REVIEW OF LABS AND IMAGING:      Results for orders placed or performed in visit on 08/26/18   AMB POC URINALYSIS DIP STICK AUTO W/ MICRO (MICRO RESULTS)   Result Value Ref Range    Color (UA POC)      Clarity (UA POC)      Glucose (UA POC) Negative Negative    Bilirubin (UA POC) Negative Negative    Ketones (UA POC) Negative Negative    Specific gravity (UA POC) 1.030 1.001 - 1.035    Blood (UA POC) Trace Negative    pH (UA POC) 5.5 4.6 - 8.0    Protein (UA POC) Negative Negative    Urobilinogen (UA POC) 0.2 mg/dL 0.2 - 1    Nitrites (UA POC) Negative Negative    Leukocyte esterase (UA POC) Negative Negative    Epithelial cells (UA POC)      WBCs (UA POC)      RBCs (UA POC)      Bacteria (UA POC) None Negative    Crystals (UA POC) Negative Negative    Other (UA POC)       PSA /TESTOSTERONE - BSHSI PSA   Latest Ref Rng & Units 0.0 - 4.0 ng/mL   08/17/2018 6.3 (H)       ASSESSMENT:     ICD-10-CM ICD-9-CM    1. Testicular pain N50.819 608.9 AMB POC URINALYSIS DIP STICK AUTO W/ MICRO (MICRO RESULTS)   2. Elevated PSA R97.20 790.93    3. Benign prostatic hyperplasia with nocturia N40.1 600.01     R35.1 788.43    4. Personal history of urinary (tract) infection Z87.440 V13.02         PLAN:    ?? Plan scrotal ultrasound same day as prostate biopsy but no concerning pathology on exam  ?? Will recommend wearing tight underwear and do hernia check next visit  ?? Continue flomax    Patient's BMI is out of the normal parameters. Information about BMI was given and patient was advised  to follow-up with their PCP for further management.    Chief Complaint   Patient presents with   ??? Testicle Pain     Medical documentation provided with the assistance of West Carbo, medical scribe for [facility], [place], [place].     Donold Marotto [place], DO

## 2018-08-31 NOTE — Telephone Encounter (Signed)
Called pt to remind of bx on 10/10 and make sure he has picked up his antibiotic and that he needs to take one hour prior to arrival time. Also want to make sure he has stopped his omega 3 and ibuprofen 7 days before procedure. No  Answer and VM was full.

## 2018-09-01 NOTE — Telephone Encounter (Signed)
Pt called to see what time he needed to be at GI today. I told him his appt is at 11:15am with arrival time of 10:45am. He verbalized understanding.

## 2018-09-02 MED ORDER — LEVOFLOXACIN 750 MG TAB
750 mg | ORAL_TABLET | Freq: Every day | ORAL | 0 refills | Status: AC
Start: 2018-09-02 — End: 2018-09-03

## 2018-09-02 NOTE — Telephone Encounter (Signed)
Abt to be taken 1 hour prior to appt time on day of biopsy

## 2018-09-03 ENCOUNTER — Ambulatory Visit: Admit: 2018-09-03 | Discharge: 2018-09-03 | Attending: Urology

## 2018-09-03 ENCOUNTER — Ambulatory Visit: Attending: Urology

## 2018-09-03 DIAGNOSIS — R972 Elevated prostate specific antigen [PSA]: Secondary | ICD-10-CM

## 2018-09-03 LAB — AMB POC URINALYSIS DIP STICK AUTO W/ MICRO (MICRO RESULTS)
Bilirubin (UA POC): NEGATIVE
Bilirubin, Urine, POC: NEGATIVE
Crystals (UA POC): NEGATIVE
Crystals (UA POC): NEGATIVE
Glucose (UA POC): NEGATIVE
Glucose, Urine, POC: NEGATIVE
Ketones (UA POC): NEGATIVE
Ketones, Urine, POC: NEGATIVE
Leukocyte Esterase, Urine, POC: NEGATIVE
Leukocyte esterase (UA POC): NEGATIVE
Nitrite, Urine, POC: NEGATIVE
Nitrites (UA POC): NEGATIVE
Protein (UA POC): NEGATIVE
Protein, Urine, POC: NEGATIVE
Specific Gravity, Urine, POC: 1.02 NA (ref 1.001–1.035)
Specific gravity (UA POC): 1.02 (ref 1.001–1.035)
Urobilinogen (UA POC): 0.2 (ref 0.2–1)
Urobilinogen, POC: 0.2 (ref 0.2–1)
pH (UA POC): 7 (ref 4.6–8.0)
pH, Urine, POC: 7 NA (ref 4.6–8.0)

## 2018-09-03 MED ORDER — CEFTRIAXONE 1 GRAM SOLUTION FOR INJECTION
1 gram | Freq: Once | INTRAMUSCULAR | 0 refills | Status: DC
Start: 2018-09-03 — End: 2018-09-03

## 2018-09-03 MED ORDER — CEFTRIAXONE 1 GRAM SOLUTION FOR INJECTION
1 gram | Freq: Once | INTRAMUSCULAR | 0 refills | Status: AC
Start: 2018-09-03 — End: 2018-09-03

## 2018-09-03 NOTE — Progress Notes (Signed)
 Steve Shaffer is a 62 y.o. male who is here today per the order of Dr. Jayme to receive Ceftriaxone.   Patient's identity has been verified.   Dr. Jayme was available in the clinic as incident to provider.     Ceftriaxone  is administered to gluteus ( right) IM without difficulty.       Patient tolerated the injection well.    Patient has been scheduled for the next injection which will be due in approximately 0 days    Encounter Diagnosis   Name Primary?   . Elevated PSA Yes       Orders Placed This Encounter   . cefTRIAXone (ROCEPHIN) 1 gram injection     Sig: 1 g by IntraMUSCular route once for 1 dose.     Dispense:  1 Vial     Refill:  0       Patient did not supply own medication.     Katheryn Williams

## 2018-09-03 NOTE — Progress Notes (Signed)
Progress Notes by Estelle June, DO at 09/03/18 0900                Author: Estelle June, DO  Service: --  Author Type: Physician       Filed: 09/08/18 1911  Encounter Date: 09/03/2018  Status: Signed          Editor: Estelle June, DO (Physician)                       TRANSRECTAL ULTRASOUND AND PROSTATE NEEDLE BIOPSY NOTE         Here for prostate biopsy for psa 6.3.  BPH on flomax with nocturia x1.  Has b/l testicular pain that is non-descript.  Hx uti.      Universal Procedure Pause:      1. Correct patient confirmed:  yes      2. Allergies confirmed:  yes      3. Patient taking antiplatelet medications:  no      4. Patient taking anticoagulants:  no      5. Correct procedure and side confirmed and consent signed:  yes      6. Correct antibiotics confirmed:  yes      Visit Vitals      BP  129/79        Pulse  62     Temp  96.2 ??F (35.7 ??C)     Resp  18     Ht  6\' 6"  (1.981 m)     Wt  229 lb 6.4 oz (104.1 kg)        BMI  26.51 kg/m??             Current Outpatient Medications on File Prior to Visit          Medication  Sig  Dispense  Refill           ?  omega 3-dha-epa-fish oil (FISH OIL) 100-160-1,000 mg cap  Take 2 Tabs by mouth.         ?  albuterol (PROVENTIL HFA, VENTOLIN HFA, PROAIR HFA) 90 mcg/actuation inhaler  inhale ONE TO TWO puffs BY MOUTH EVERY 4 HOURS AS NEEDED    0     ?  amoxicillin-clavulanate (AUGMENTIN) 875-125 mg per tablet  TAKE ONE TABLET BY MOUTH TWICE DAILY with food    0     ?  ferrous sulfate 325 mg (65 mg iron) tablet  Take 325 mg by mouth.         ?  ibuprofen (MOTRIN) 200 mg tablet  Take 600-800 mg by mouth.         ?  omega-3 acid ethyl esters (LOVAZA) 1 gram capsule  Take 2 Caps by mouth.               ?  predniSONE (DELTASONE) 20 mg tablet  TAKE THREE TABLETS BY MOUTH ONCE DAILY with food    0           ?  rosuvastatin (CRESTOR) 10 mg tablet  Take  by mouth.               ?  tamsulosin (FLOMAX) 0.4 mg capsule  Take 1 Cap by mouth nightly.  30 Cap  12          No  current facility-administered medications on file prior to visit.            No Known Allergies      PSA:  6.3 (08/17/18)       Prebiopsy diagnosis:  Elevated PSA      Postbiopsy diagnosis:  Same      Procedure performed:  Transrectal [place] and Echo guided Prostate Needle Biopsy      Surgeon: [facility], DO      Palpable Nodule: no      Informed consent: Yes. Reviewed again bleeding, infection, sepsis death and others. All questions answered and patient agrees.      Procedure:   Patient has confirmed that the oral antibiotic Levaquin was taken prior to the procedure. IM Ceftriaxone 1g was given by staff.  The patient verifies that he is not taking blood thinners or aspirin.   Urinalysis noted to be negative for infection.         He was placed in the left lateral decubitus position with knees drawn up.   An endorectal probe was placed in the rectum. Anesthesia: Prostate Block - 1 % Xylocaine was injected in an appropriate fashion. Transverse and sagittal images of the prostate and seminal vesicles were obtained  and measurements were recorded.  Using the needle guide on the probe, a biopsy needle was used to obtain biopsies of the prostate in a  standard fashion.  If seen, any abnormal  lesions were targeted along with systematic biopsies from each side.  Tissue cores were placed in formalin jars and sent for pathologic review.        The probe was removed and the patient was observed.  Appropriate activity levels and timing of resuming sexual activity was reviewed.   Patient was warned of potential complications and with instructions on how to contact our office. This includes but not limited to elevated temperature, excessive bleeding per urethra or rectum as well as others.  All questions were answered to his satisfaction.          Findings:     DRE-  Prostate gland examined and noted to be without prostate nodule   TRUS Volume:  23.56 cm3    Hypoechoic Mass:  NO   Seminal vesicles:  Normal and symmetrical    Biopsy Pattern:            Base  Mid   Apex   Left  1         1   1          Right    1   1   1      Lateral left 1   1   1      Lateral right 1   1   1                 Plan:    ??  The patient will have curtailed activity today and drink a lot of fluid. He will call with any problems or questions.    ??  The patient will be called within 2 weeks from now to go over the results of the biopsy in office.    ??  Scrotal ultrasound ordered, though no pathology on exam   ??  Will do hernia check next visit      [employer], DO, [address]

## 2018-09-03 NOTE — Progress Notes (Signed)
TRANSRECTAL ULTRASOUND AND PROSTATE NEEDLE BIOPSY NOTE      Here for prostate biopsy for psa 6.3.  BPH on flomax with nocturia x1.  Has b/l testicular pain that is non-descript.  Hx uti.    Universal Procedure Pause:    1. Correct patient confirmed:  yes    2. Allergies confirmed:  yes    3. Patient taking antiplatelet medications:  no    4. Patient taking anticoagulants:  no    5. Correct procedure and side confirmed and consent signed:  yes    6. Correct antibiotics confirmed:  yes    Visit Vitals  BP 129/79   Pulse 62   Temp 96.2 ??F (35.7 ??C)   Resp 18   Ht 6\' 6"  (1.981 m)   Wt 229 lb 6.4 oz (104.1 kg)   BMI 26.51 kg/m??       Current Outpatient Medications on File Prior to Visit   Medication Sig Dispense Refill   ??? omega 3-dha-epa-fish oil (FISH OIL) 100-160-1,000 mg cap Take 2 Tabs by mouth.     ??? albuterol (PROVENTIL HFA, VENTOLIN HFA, PROAIR HFA) 90 mcg/actuation inhaler inhale ONE TO TWO puffs BY MOUTH EVERY 4 HOURS AS NEEDED  0   ??? amoxicillin-clavulanate (AUGMENTIN) 875-125 mg per tablet TAKE ONE TABLET BY MOUTH TWICE DAILY with food  0   ??? ferrous sulfate 325 mg (65 mg iron) tablet Take 325 mg by mouth.     ??? ibuprofen (MOTRIN) 200 mg tablet Take 600-800 mg by mouth.     ??? omega-3 acid ethyl esters (LOVAZA) 1 gram capsule Take 2 Caps by mouth.     ??? predniSONE (DELTASONE) 20 mg tablet TAKE THREE TABLETS BY MOUTH ONCE DAILY with food  0   ??? rosuvastatin (CRESTOR) 10 mg tablet Take  by mouth.     ??? tamsulosin (FLOMAX) 0.4 mg capsule Take 1 Cap by mouth nightly. 30 Cap 12     No current facility-administered medications on file prior to visit.        No Known Allergies    PSA:  6.3 (08/17/18)     Prebiopsy diagnosis:  Elevated PSA    Postbiopsy diagnosis:  Same    Procedure performed:  Transrectal [place] and Echo guided Prostate Needle Biopsy    Surgeon: [facility], DO    Palpable Nodule: no    Informed consent: Yes. Reviewed again bleeding, infection, sepsis death  and others. All questions answered and patient agrees.    Procedure:  Patient has confirmed that the oral antibiotic Levaquin was taken prior to the procedure. IM Ceftriaxone 1g was given by staff.  The patient verifies that he is not taking blood thinners or aspirin.  Urinalysis noted to be negative for infection.       He was placed in the left lateral decubitus position with knees drawn up.  An endorectal probe was placed in the rectum. Anesthesia: Prostate Block - 1% Xylocaine was injected in an appropriate fashion. Transverse and sagittal images of the prostate and seminal vesicles were obtained and measurements were recorded.  Using the needle guide on the probe, a biopsy needle was used to obtain biopsies of the prostate in a standard fashion.  If seen, any abnormal lesions were targeted along with systematic biopsies from each side.  Tissue cores were placed in formalin jars and sent for pathologic review.     The probe was removed and the patient was observed.  Appropriate activity levels and timing of  resuming sexual activity was reviewed.  Patient was warned of potential complications and with instructions on how to contact our office. This includes but not limited to elevated temperature, excessive bleeding per urethra or rectum as well as others.  All questions were answered to his satisfaction.       Findings:    DRE-  Prostate gland examined and noted to be without prostate nodule  TRUS Volume:  23.56 cm3   Hypoechoic Mass:  NO  Seminal vesicles:  Normal and symmetrical  Biopsy Pattern:         Base  Mid   Apex  Left  1         1   1         Right    1   1   1     Lateral left 1   1   1     Lateral right 1   1   1             Plan:   ?? The patient will have curtailed activity today and drink a lot of fluid. He will call with any problems or questions.   ?? The patient will be called within 2 weeks from now to go over the results of the biopsy in office.    ?? Scrotal ultrasound ordered, though no pathology on exam  ?? Will do hernia check next visit    [employer] LifeChristi Shavonn Convey, DO, [address]

## 2018-09-03 NOTE — Progress Notes (Signed)
Steve Shaffer is a 61 y.o. male who is here today per the order of Dr. Hughart to receive Ceftriaxone.   Patient's identity has been verified.   Dr. Hughart was available in the clinic as incident to provider.     Ceftriaxone  is administered to gluteus ( right) IM without difficulty.       Patient tolerated the injection well.    Patient has been scheduled for the next injection which will be due in approximately 0 days    Encounter Diagnosis   Name Primary?   ??? Elevated PSA Yes       Orders Placed This Encounter   ??? cefTRIAXone (ROCEPHIN) 1 gram injection     Sig: 1 g by IntraMUSCular route once for 1 dose.     Dispense:  1 Vial     Refill:  0       Patient did not supply own medication.     Jadene Stemmer

## 2018-09-04 ENCOUNTER — Encounter

## 2018-09-04 NOTE — Telephone Encounter (Signed)
Called pt to let him know that his Scrotal us is on 10/17 @ 10am. There is no prep needed and he can call [phone] to register.  He verbalized understanding.

## 2018-09-08 NOTE — Telephone Encounter (Signed)
Called pts insurance to see if needed PA for scrotal US and spoke with a crystal p. She stated that as long as all parties are in network than no pa is required. I called Kim at hospital to make sure pts insurance is in network and she confirmed that it is in network.

## 2018-09-09 NOTE — Progress Notes (Signed)
Get in this week for f/u pr bx

## 2018-09-10 ENCOUNTER — Ambulatory Visit: Admit: 2018-09-10 | Discharge: 2018-09-10 | Attending: Urology

## 2018-09-10 ENCOUNTER — Ambulatory Visit: Attending: Urology

## 2018-09-10 DIAGNOSIS — R972 Elevated prostate specific antigen [PSA]: Secondary | ICD-10-CM

## 2018-09-10 MED ORDER — CEFUROXIME AXETIL 500 MG TAB
500 mg | ORAL_TABLET | Freq: Two times a day (BID) | ORAL | 0 refills | Status: AC
Start: 2018-09-10 — End: 2018-10-01

## 2018-09-10 NOTE — Progress Notes (Signed)
Progress Notes by Estelle June, DO at 09/10/18 1300                Author: Estelle June, DO  Service: --  Author Type: Physician       Filed: 09/10/18 1505  Encounter Date: 09/10/2018  Status: Signed          Editor: Estelle June, DO (Physician)                              HISTORY OF PRESENT ILLNESS:  Steve Shaffer  is a 62 y.o. male who presents  today for f/u prostate biopsy for psa of 6.3.  Path neg except for chronic inflammation.  Patient also notes recent bilateral testicular pain.  BPH with nocturia x1, Hx UTI.  Volume of prostate 23g.              AUA Symptom Score  09/10/2018        Over the past month how often have you had the sensation that your bladder was not completely empty after you finished urinating?  0     Over the past month, how often have had to urinate again less than 2 hours after you last finished urinating?  0     Over the past month, how often have you found you stopped and started again several times when you urinated?  0     Over the past month, how often have you found it difficult to postpone urination?  0     Over the past month, how often have you had a weak urinary stream?  0     Over the past month, how often have you had to push or strain to begin urinating?  0     Over the past month, how many times did you most typically get up to urinate from the time you went to bed at night until the time you got up in  the morning?  0     AUA Score  0        If you were to spend the rest of your life with your urinary condition the way it is now, how would you feel about that?  Delighted             Past Medical History:        Diagnosis  Date         ?  Elevated liver enzymes       ?  History of cancer tonsil       ?  Hypercholesterolemia       ?  Hypertension       ?  Tonsil cancer (HCC)           ?  UTI (urinary tract infection)               Past Surgical History:         Procedure  Laterality  Date          ?  HX TONSILLECTOMY         ?  HX TONSILLECTOMY          ?  HX UROLOGICAL    09/03/2018          TRUS Volume:  23.56 cm3 .  Benign pathology 12/12 cores.  PSA 6.3  Dr Cleda Mccreedy             Social History  Tobacco Use         ?  Smoking status:  Former Smoker              Last attempt to quit:  2016         Years since quitting:  3.7         ?  Smokeless tobacco:  Never Used       Substance Use Topics         ?  Alcohol use:  Yes              Frequency:  2-3 times a week         ?  Drug use:  Never           No Known Allergies        Family History       Adopted: Yes       Family history unknown: Yes             Current Outpatient Medications          Medication  Sig  Dispense  Refill           ?  guaiFENesin (MUCINEX) 1,200 mg Ta12 ER tablet  Take 1,200 mg by mouth two (2) times a day.         ?  cefUROXime (CEFTIN) 500 mg tablet  Take 1 Tab by mouth two (2) times a day for 21 days.  42 Tab  0     ?  omega 3-dha-epa-fish oil (FISH OIL) 100-160-1,000 mg cap  Take 2 Tabs by mouth.         ?  albuterol (PROVENTIL HFA, VENTOLIN HFA, PROAIR HFA) 90 mcg/actuation inhaler  inhale ONE TO TWO puffs BY MOUTH EVERY 4 HOURS AS NEEDED    0     ?  ferrous sulfate 325 mg (65 mg iron) tablet  Take 325 mg by mouth.         ?  ibuprofen (MOTRIN) 200 mg tablet  Take 600-800 mg by mouth.         ?  omega-3 acid ethyl esters (LOVAZA) 1 gram capsule  Take 2 Caps by mouth.         ?  predniSONE (DELTASONE) 20 mg tablet  TAKE THREE TABLETS BY MOUTH ONCE DAILY with food    0     ?  rosuvastatin (CRESTOR) 10 mg tablet  Take  by mouth.               ?  tamsulosin (FLOMAX) 0.4 mg capsule  Take 1 Cap by mouth nightly.  30 Cap  12              REVIEW OF SYSTEMS:   Constitutional: Fever: No   Skin: Rash: No   HEENT: Hearing difficulty: No   Eyes: Blurred vision: No   Cardiovascular: Chest pain: No   Respiratory: Shortness of breath: No   Gastrointestinal: Nausea/vomiting: No   Musculoskeletal: Back pain: No   Neurological: Weakness: No   Psychological: Memory loss: No   Comments/additional  findings:          PHYSICAL EXAMINATION:    Visit Vitals      BP  116/78     Pulse  83     Temp  98.4 ??F (36.9 ??C)     Resp  18     Ht  6\' 6"  (1.981 m)     Wt  230 lb 12.8  oz (104.7 kg)        BMI  26.67 kg/m??        Constitutional: Well developed, no acute distress.    Eyes:  Conjunctiva normal.   Ears:  External ear normal.   Nose/Throat:  External nose normal.   CV:  Heart rate regular, No peripheral swelling noted.   Respiratory: No respiratory distress, No audible wheeze.   Skin: No rash, No ulcer.     Neuro/Psych:  Patient with appropriate affect.  Alert and oriented x 3.     Gait:  Normal.         REVIEW OF LABS AND IMAGING:          Results for orders placed or performed in visit on 09/03/18     AMB POC URINALYSIS DIP STICK AUTO W/ MICRO (MICRO RESULTS)         Result  Value  Ref Range            Color (UA POC)           Clarity (UA POC)           Glucose (UA POC)  Negative  Negative       Bilirubin (UA POC)  Negative  Negative       Ketones (UA POC)  Negative  Negative       Specific gravity (UA POC)  1.020  1.001 - 1.035       Blood (UA POC)  Trace  Negative       pH (UA POC)  7.0  4.6 - 8.0       Protein (UA POC)  Negative  Negative       Urobilinogen (UA POC)  0.2 mg/dL  0.2 - 1       Nitrites (UA POC)  Negative  Negative       Leukocyte esterase (UA POC)  Negative  Negative       Epithelial cells (UA POC)           WBCs (UA POC)           RBCs (UA POC)           Bacteria (UA POC)  None  Negative       Crystals (UA POC)  Negative  Negative            Other (UA POC)               ASSESSMENT:              ICD-10-CM  ICD-9-CM             1.  Elevated PSA  R97.20  790.93       2.  Benign prostatic hyperplasia with nocturia  N40.1  600.01              R35.1  788.43             3.  Testicular pain  N50.819  608.9       4.  Personal history of urinary (tract) infection  Z87.440  V13.02             5.  Orchitis  N45.2  604.90                       PLAN:     ??  rx cefuroxime 3 wks for possible  orchitis/prostatitis   ??  F/u 3wks   ??  psa after  Patient's BMI is out of the normal parameters.  Information about BMI was given and patient was advised to follow-up with their PCP for further management.              Chief Complaint       Patient presents with        ?  Follow-up             Prostate biopsy              A copy of today's office visit was sent to the referring physician.        Catherine Oak DentonHughart, DO

## 2018-09-10 NOTE — Progress Notes (Signed)
HISTORY OF PRESENT ILLNESS:  Steve Shaffer is a 62 y.o. male who presents today for f/u prostate biopsy for psa of 6.3.  Path neg except for chronic inflammation.  Patient also notes recent bilateral testicular pain.  BPH with nocturia x1, Hx UTI.  Volume of prostate 23g.        AUA Symptom Score 09/10/2018   Over the past month how often have you had the sensation that your bladder was not completely empty after you finished urinating? 0   Over the past month, how often have had to urinate again less than 2 hours after you last finished urinating? 0   Over the past month, how often have you found you stopped and started again several times when you urinated? 0   Over the past month, how often have you found it difficult to postpone urination? 0   Over the past month, how often have you had a weak urinary stream? 0   Over the past month, how often have you had to push or strain to begin urinating? 0   Over the past month, how many times did you most typically get up to urinate from the time you went to bed at night until the time you got up in the morning? 0   AUA Score 0   If you were to spend the rest of your life with your urinary condition the way it is now, how would you feel about that? Delighted       Past Medical History:   Diagnosis Date   ??? Elevated liver enzymes    ??? History of cancer tonsil    ??? Hypercholesterolemia    ??? Hypertension    ??? Tonsil cancer (HCC)    ??? UTI (urinary tract infection)        Past Surgical History:   Procedure Laterality Date   ??? HX TONSILLECTOMY     ??? HX TONSILLECTOMY     ??? HX UROLOGICAL  09/03/2018    TRUS Volume:  23.56 cm3 .  Benign pathology 12/12 cores.  PSA 6.3  Dr Cleda Mccreedy       Social History     Tobacco Use   ??? Smoking status: Former Smoker     Last attempt to quit: 2016     Years since quitting: 3.7   ??? Smokeless tobacco: Never Used   Substance Use Topics   ??? Alcohol use: Yes     Frequency: 2-3 times a week   ??? Drug use: Never       No Known Allergies     Family History   Adopted: Yes   Family history unknown: Yes       Current Outpatient Medications   Medication Sig Dispense Refill   ??? guaiFENesin (MUCINEX) 1,200 mg Ta12 ER tablet Take 1,200 mg by mouth two (2) times a day.     ??? cefUROXime (CEFTIN) 500 mg tablet Take 1 Tab by mouth two (2) times a day for 21 days. 42 Tab 0   ??? omega 3-dha-epa-fish oil (FISH OIL) 100-160-1,000 mg cap Take 2 Tabs by mouth.     ??? albuterol (PROVENTIL HFA, VENTOLIN HFA, PROAIR HFA) 90 mcg/actuation inhaler inhale ONE TO TWO puffs BY MOUTH EVERY 4 HOURS AS NEEDED  0   ??? ferrous sulfate 325 mg (65 mg iron) tablet Take 325 mg by mouth.     ??? ibuprofen (MOTRIN) 200 mg tablet Take 600-800 mg by mouth.     ??? omega-3  acid ethyl esters (LOVAZA) 1 gram capsule Take 2 Caps by mouth.     ??? predniSONE (DELTASONE) 20 mg tablet TAKE THREE TABLETS BY MOUTH ONCE DAILY with food  0   ??? rosuvastatin (CRESTOR) 10 mg tablet Take  by mouth.     ??? tamsulosin (FLOMAX) 0.4 mg capsule Take 1 Cap by mouth nightly. 30 Cap 12         REVIEW OF SYSTEMS:  Constitutional: Fever: No  Skin: Rash: No  HEENT: Hearing difficulty: No  Eyes: Blurred vision: No  Cardiovascular: Chest pain: No  Respiratory: Shortness of breath: No  Gastrointestinal: Nausea/vomiting: No  Musculoskeletal: Back pain: No  Neurological: Weakness: No  Psychological: Memory loss: No  Comments/additional findings:       PHYSICAL EXAMINATION:   Visit Vitals  BP 116/78   Pulse 83   Temp 98.4 ??F (36.9 ??C)   Resp 18   Ht 6\' 6"  (1.981 m)   Wt 230 lb 12.8 oz (104.7 kg)   BMI 26.67 kg/m??     Constitutional: Well developed, no acute distress.   Eyes:  Conjunctiva normal.  Ears:  External ear normal.  Nose/Throat:  External nose normal.  CV:  Heart rate regular, No peripheral swelling noted.  Respiratory: No respiratory distress, No audible wheeze.  Skin: No rash, No ulcer.    Neuro/Psych:  Patient with appropriate affect.  Alert and oriented x 3.    Gait:  Normal.      REVIEW OF LABS AND IMAGING:       Results for orders placed or performed in visit on 09/03/18   AMB POC URINALYSIS DIP STICK AUTO W/ MICRO (MICRO RESULTS)   Result Value Ref Range    Color (UA POC)      Clarity (UA POC)      Glucose (UA POC) Negative Negative    Bilirubin (UA POC) Negative Negative    Ketones (UA POC) Negative Negative    Specific gravity (UA POC) 1.020 1.001 - 1.035    Blood (UA POC) Trace Negative    pH (UA POC) 7.0 4.6 - 8.0    Protein (UA POC) Negative Negative    Urobilinogen (UA POC) 0.2 mg/dL 0.2 - 1    Nitrites (UA POC) Negative Negative    Leukocyte esterase (UA POC) Negative Negative    Epithelial cells (UA POC)      WBCs (UA POC)      RBCs (UA POC)      Bacteria (UA POC) None Negative    Crystals (UA POC) Negative Negative    Other (UA POC)         ASSESSMENT:     ICD-10-CM ICD-9-CM    1. Elevated PSA R97.20 790.93    2. Benign prostatic hyperplasia with nocturia N40.1 600.01     R35.1 788.43    3. Testicular pain N50.819 608.9    4. Personal history of urinary (tract) infection Z87.440 V13.02    5. Orchitis N45.2 604.90               PLAN:    ?? rx cefuroxime 3 wks for possible orchitis/prostatitis  ?? F/u 3wks  ?? psa after         Patient's BMI is out of the normal parameters.  Information about BMI was given and patient was advised to follow-up with their PCP for further management.        Chief Complaint   Patient presents with   ??? Follow-up  Prostate biopsy         A copy of today's office visit was sent to the referring physician.      Rinda Rollyson [place], DO

## 2018-10-07 ENCOUNTER — Ambulatory Visit: Admit: 2018-10-07 | Discharge: 2018-10-07 | Attending: Urology

## 2018-10-07 ENCOUNTER — Ambulatory Visit: Attending: Urology

## 2018-10-07 DIAGNOSIS — N452 Orchitis: Secondary | ICD-10-CM

## 2018-10-07 NOTE — Progress Notes (Signed)
Tell patient psa lower than in past.

## 2018-10-07 NOTE — Progress Notes (Signed)
 Elspeth JAYSON Piety presents today for lab draw per Dr. Jayme order.   Dr. Jayme was present in the clinic as incident to.     PSA obtained via venipuncture without any difficulty.    Patient will be notified with lab results.       Orders Placed This Encounter   . PROSTATE SPECIFIC AG (PSA)     Standing Status:   Future     Number of Occurrences:   1     Standing Expiration Date:   04/07/2019   . COLLECTION VENOUS [employer], LPN

## 2018-10-07 NOTE — Progress Notes (Signed)
Progress Notes by Estelle JuneHughart, Lygia Olaes, DO at 10/07/18 [id number]                Author: Estelle JuneHughart, Emory Leaver, DO  Service: --  Author Type: Physician       Filed: 10/29/18 1604  Encounter Date: 10/07/2018  Status: Addendum          Editor: Estelle JuneHughart, Jaimen Melone, DO (Physician)          Related Notes: Original Note by Estelle JuneHughart, Shevy Yaney, DO (Physician) filed at 10/09/18 1321                              HISTORY OF PRESENT ILLNESS:  Steve Shaffer  is a 62 y.o. male who presents  today for f/u prostate biopsy for psa of 6.3.  Path neg except for chronic inflammation.  Patient also notes recent bilateral testicular pain.  BPH with nocturia x1, Hx UTI.  Volume of prostate 23g.  At last visit, was started on cefuroxime 3 wks for  possible orchitis/prostatitis.  Patient notes he still has testicular pain.  Is wearing tight underwear which has helped minimally.              AUA Symptom Score  10/07/2018        Over the past month how often have you had the sensation that your bladder was not completely empty after you finished urinating?  0     Over the past month, how often have had to urinate again less than 2 hours after you last finished urinating?  0     Over the past month, how often have you found you stopped and started again several times when you urinated?  0     Over the past month, how often have you found it difficult to postpone urination?  0     Over the past month, how often have you had a weak urinary stream?  0     Over the past month, how often have you had to push or strain to begin urinating?  0     Over the past month, how many times did you most typically get up to urinate from the time you went to bed at night until the time you got up in  the morning?  1     AUA Score  1        If you were to spend the rest of your life with your urinary condition the way it is now, how would you feel about that?  Unhappy             Past Medical History:        Diagnosis  Date         ?  Elevated liver enzymes       ?   History of cancer tonsil       ?  Hypercholesterolemia       ?  Hypertension       ?  Tonsil cancer (HCC)           ?  UTI (urinary tract infection)               Past Surgical History:         Procedure  Laterality  Date          ?  HX TONSILLECTOMY         ?  HX TONSILLECTOMY         ?  HX UROLOGICAL    09/03/2018          TRUS Volume:  23.56 cm3 .  Benign pathology 12/12 cores.  PSA 6.3  Dr Cleda Mccreedy             Social History          Tobacco Use         ?  Smoking status:  Former Smoker              Last attempt to quit:  2016         Years since quitting:  3.8         ?  Smokeless tobacco:  Never Used       Substance Use Topics         ?  Alcohol use:  Yes              Frequency:  2-3 times a week         ?  Drug use:  Never           No Known Allergies        Family History       Adopted: Yes       Family history unknown: Yes             Current Outpatient Medications          Medication  Sig  Dispense  Refill           ?  guaiFENesin (MUCINEX) 1,200 mg Ta12 ER tablet  Take 1,200 mg by mouth two (2) times a day.         ?  omega 3-dha-epa-fish oil (FISH OIL) 100-160-1,000 mg cap  Take 2 Tabs by mouth.         ?  albuterol (PROVENTIL HFA, VENTOLIN HFA, PROAIR HFA) 90 mcg/actuation inhaler  inhale ONE TO TWO puffs BY MOUTH EVERY 4 HOURS AS NEEDED    0     ?  ferrous sulfate 325 mg (65 mg iron) tablet  Take 325 mg by mouth.         ?  ibuprofen (MOTRIN) 200 mg tablet  Take 600-800 mg by mouth.         ?  omega-3 acid ethyl esters (LOVAZA) 1 gram capsule  Take 2 Caps by mouth.         ?  predniSONE (DELTASONE) 20 mg tablet  TAKE THREE TABLETS BY MOUTH ONCE DAILY with food    0     ?  rosuvastatin (CRESTOR) 10 mg tablet  Take  by mouth.               ?  tamsulosin (FLOMAX) 0.4 mg capsule  Take 1 Cap by mouth nightly.  30 Cap  12              REVIEW OF SYSTEMS:   Constitutional: Fever: No   Skin: Rash: No   HEENT: Hearing difficulty: No   Eyes: Blurred vision: No   Cardiovascular: Chest pain: No   Respiratory:  Shortness of breath: No   Gastrointestinal: Nausea/vomiting: No   Musculoskeletal: Back pain: No   Neurological: Weakness: No   Psychological: Memory loss: No   Comments/additional findings:          PHYSICAL EXAMINATION:    Visit Vitals      BP  (!) 136/97     Pulse  88     Temp  97.5 ??F (36.4 ??C)  Resp  18     Ht  6\' 6"  (1.981 m)     Wt  232 lb 6.4 oz (105.4 kg)        BMI  26.86 kg/m??        Constitutional: Well developed, no acute distress.    Eyes:  Conjunctiva normal.   Ears:  External ear normal.   Nose/Throat:  External nose normal.   CV:  Heart rate regular, No peripheral swelling noted.   Respiratory: No respiratory distress, No audible wheeze.   Skin: No rash, No ulcer.     Neuro/Psych:  Patient with appropriate affect.  Alert and oriented x 3.     Gait:  Normal.         REVIEW OF LABS AND IMAGING:          Results for orders placed or performed in visit on 10/07/18     PSA, DIAGNOSTIC (PROSTATE SPECIFIC AG)         Result  Value  Ref Range            Prostate Specific Ag  5.9 (H)  0.0 - 4.0 ng/mL           ASSESSMENT:              ICD-10-CM  ICD-9-CM             1.  Orchitis  N45.2  604.90       2.  Elevated PSA  R97.20  790.93  COLLECTION VENOUS BLOOD,VENIPUNCTURE                PSA, DIAGNOSTIC (PROSTATE SPECIFIC AG)           PSA, DIAGNOSTIC (PROSTATE SPECIFIC AG)           3.  Benign prostatic hyperplasia with nocturia  N40.1  600.01              R35.1  788.43             4.  Personal history of urinary (tract) infection  Z87.440  V13.02                       PLAN:     ??  Continue wearing supportive underwear   ??  Scrotal ultrasound ordered   ??  psa today    ??  psa in 51mo with exam              Patient's BMI is out of the normal parameters.  Information about BMI was given and patient was advised to follow-up with their PCP for further management.           Chief Complaint       Patient presents with        ?  Testicle Pain           A copy of today's office visit was sent to the referring  physician.        Renita Brocks [place], DO      Documentation was provided with the assistance of Jettie Pagan, medical scribe for [employer], DO.

## 2018-10-07 NOTE — Progress Notes (Signed)
Tell patient psa lower than in past.

## 2018-10-07 NOTE — Progress Notes (Addendum)
HISTORY OF PRESENT ILLNESS:  Steve Shaffer is a 62 y.o. male who presents today for f/u prostate biopsy for psa of 6.3.  Path neg except for chronic inflammation.  Patient also notes recent bilateral testicular pain.  BPH with nocturia x1, Hx UTI.  Volume of prostate 23g.  At last visit, was started on cefuroxime 3 wks for possible orchitis/prostatitis.  Patient notes he still has testicular pain.  Is wearing tight underwear which has helped minimally.        AUA Symptom Score 10/07/2018   Over the past month how often have you had the sensation that your bladder was not completely empty after you finished urinating? 0   Over the past month, how often have had to urinate again less than 2 hours after you last finished urinating? 0   Over the past month, how often have you found you stopped and started again several times when you urinated? 0   Over the past month, how often have you found it difficult to postpone urination? 0   Over the past month, how often have you had a weak urinary stream? 0   Over the past month, how often have you had to push or strain to begin urinating? 0   Over the past month, how many times did you most typically get up to urinate from the time you went to bed at night until the time you got up in the morning? 1   AUA Score 1   If you were to spend the rest of your life with your urinary condition the way it is now, how would you feel about that? Unhappy       Past Medical History:   Diagnosis Date   ??? Elevated liver enzymes    ??? History of cancer tonsil    ??? Hypercholesterolemia    ??? Hypertension    ??? Tonsil cancer (HCC)    ??? UTI (urinary tract infection)        Past Surgical History:   Procedure Laterality Date   ??? HX TONSILLECTOMY     ??? HX TONSILLECTOMY     ??? HX UROLOGICAL  09/03/2018    TRUS Volume:  23.56 cm3 .  Benign pathology 12/12 cores.  PSA 6.3  Dr Cleda MccreedyHughart       Social History     Tobacco Use   ??? Smoking status: Former Smoker     Last attempt to quit: 2016      Years since quitting: 3.8   ??? Smokeless tobacco: Never Used   Substance Use Topics   ??? Alcohol use: Yes     Frequency: 2-3 times a week   ??? Drug use: Never       No Known Allergies    Family History   Adopted: Yes   Family history unknown: Yes       Current Outpatient Medications   Medication Sig Dispense Refill   ??? guaiFENesin (MUCINEX) 1,200 mg Ta12 ER tablet Take 1,200 mg by mouth two (2) times a day.     ??? omega 3-dha-epa-fish oil (FISH OIL) 100-160-1,000 mg cap Take 2 Tabs by mouth.     ??? albuterol (PROVENTIL HFA, VENTOLIN HFA, PROAIR HFA) 90 mcg/actuation inhaler inhale ONE TO TWO puffs BY MOUTH EVERY 4 HOURS AS NEEDED  0   ??? ferrous sulfate 325 mg (65 mg iron) tablet Take 325 mg by mouth.     ??? ibuprofen (MOTRIN) 200 mg tablet Take 600-800 mg by mouth.     ???  omega-3 acid ethyl esters (LOVAZA) 1 gram capsule Take 2 Caps by mouth.     ??? predniSONE (DELTASONE) 20 mg tablet TAKE THREE TABLETS BY MOUTH ONCE DAILY with food  0   ??? rosuvastatin (CRESTOR) 10 mg tablet Take  by mouth.     ??? tamsulosin (FLOMAX) 0.4 mg capsule Take 1 Cap by mouth nightly. 30 Cap 12         REVIEW OF SYSTEMS:  Constitutional: Fever: No  Skin: Rash: No  HEENT: Hearing difficulty: No  Eyes: Blurred vision: No  Cardiovascular: Chest pain: No  Respiratory: Shortness of breath: No  Gastrointestinal: Nausea/vomiting: No  Musculoskeletal: Back pain: No  Neurological: Weakness: No  Psychological: Memory loss: No  Comments/additional findings:       PHYSICAL EXAMINATION:   Visit Vitals  BP (!) 136/97   Pulse 88   Temp 97.5 ??F (36.4 ??C)   Resp 18   Ht 6\' 6"  (1.981 m)   Wt 232 lb 6.4 oz (105.4 kg)   BMI 26.86 kg/m??     Constitutional: Well developed, no acute distress.   Eyes:  Conjunctiva normal.  Ears:  External ear normal.  Nose/Throat:  External nose normal.  CV:  Heart rate regular, No peripheral swelling noted.  Respiratory: No respiratory distress, No audible wheeze.  Skin: No rash, No ulcer.     Neuro/Psych:  Patient with appropriate affect.  Alert and oriented x 3.    Gait:  Normal.      REVIEW OF LABS AND IMAGING:      Results for orders placed or performed in visit on 10/07/18   PSA, DIAGNOSTIC (PROSTATE SPECIFIC AG)   Result Value Ref Range    Prostate Specific Ag 5.9 (H) 0.0 - 4.0 ng/mL       ASSESSMENT:     ICD-10-CM ICD-9-CM    1. Orchitis N45.2 604.90    2. Elevated PSA R97.20 790.93 COLLECTION VENOUS BLOOD,VENIPUNCTURE      PSA, DIAGNOSTIC (PROSTATE SPECIFIC AG)      PSA, DIAGNOSTIC (PROSTATE SPECIFIC AG)   3. Benign prostatic hyperplasia with nocturia N40.1 600.01     R35.1 788.43    4. Personal history of urinary (tract) infection Z87.440 V13.02               PLAN:    ?? Continue wearing supportive underwear  ?? Scrotal ultrasound ordered  ?? psa today   ?? psa in 57mo with exam         Patient's BMI is out of the normal parameters.  Information about BMI was given and patient was advised to follow-up with their PCP for further management.      Chief Complaint   Patient presents with   ??? Testicle Pain       A copy of today's office visit was sent to the referring physician.      Andrews Tener [place], DO    Documentation was provided with the assistance of Jettie Pagan, medical scribe for [employer], DO.

## 2018-10-07 NOTE — Progress Notes (Signed)
Steve Shaffer presents today for lab draw per Dr. Hughart order.   Dr. Hughart was present in the clinic as incident to.     PSA obtained via venipuncture without any difficulty.    Patient will be notified with lab results.       Orders Placed This Encounter   ??? PROSTATE SPECIFIC AG (PSA)     Standing Status:   Future     Number of Occurrences:   1     Standing Expiration Date:   04/07/2019   ??? COLLECTION VENOUS BLOOD,VENIPUNCTURE       Lenin Kuhnle, LPN

## 2018-10-08 LAB — PSA PROSTATIC SPECIFIC ANTIGEN: PSA: 5.9 ng/mL — ABNORMAL HIGH (ref 0.0–4.0)

## 2018-10-08 LAB — PSA, DIAGNOSTIC (PROSTATE SPECIFIC AG): Prostate Specific Ag: 5.9 ng/mL — ABNORMAL HIGH (ref 0.0–4.0)

## 2018-10-08 NOTE — Telephone Encounter (Signed)
-----   Message from [facility] Hug[facility], [place] sent at 10/08/2018  8:50 AM EST -----  Tell patient psa lower than in past.

## 2018-10-08 NOTE — Telephone Encounter (Signed)
Advised pt of results.

## 2018-10-26 NOTE — Telephone Encounter (Signed)
PT called to speak with Dondra SpryGail about updating something to get ? US done? I let him know that Dondra SpryGail was away from her desk but Jeanice LimHolly wrote down the message to call him back.

## 2018-10-29 ENCOUNTER — Ambulatory Visit: Admit: 2018-10-29 | Discharge: 2018-10-29 | Attending: Urology

## 2018-10-29 ENCOUNTER — Ambulatory Visit: Attending: Urology

## 2018-10-29 DIAGNOSIS — R972 Elevated prostate specific antigen [PSA]: Secondary | ICD-10-CM

## 2018-10-29 NOTE — Progress Notes (Signed)
Progress Notes by Estelle June, DO at 10/29/18 1530                Author: Estelle June, DO  Service: --  Author Type: Physician       Filed: 11/04/18 1041  Encounter Date: 10/29/2018  Status: Signed          Editor: Estelle June, DO (Physician)                              HISTORY OF PRESENT ILLNESS:  Steve Shaffer  is a 62 y.o. male who presents  today for f/u scrotal ultrasound which shows small bilateral hydroceles.  Prostate biopsy for psa of 6.3 with path neg except for chronic inflammation.  Patient also notes recent bilateral testicular pain.  BPH with nocturia x1, Hx UTI.  Volume of prostate  23g.              AUA Symptom Score  10/29/2018        Over the past month how often have you had the sensation that your bladder was not completely empty after you finished urinating?  0     Over the past month, how often have had to urinate again less than 2 hours after you last finished urinating?  0     Over the past month, how often have you found you stopped and started again several times when you urinated?  0     Over the past month, how often have you found it difficult to postpone urination?  0     Over the past month, how often have you had a weak urinary stream?  0     Over the past month, how often have you had to push or strain to begin urinating?  0     Over the past month, how many times did you most typically get up to urinate from the time you went to bed at night until the time you got up in  the morning?  1     AUA Score  1        If you were to spend the rest of your life with your urinary condition the way it is now, how would you feel about that?  Unhappy             Past Medical History:        Diagnosis  Date         ?  Elevated liver enzymes       ?  History of cancer tonsil       ?  Hypercholesterolemia       ?  Hypertension       ?  Tonsil cancer (HCC)           ?  UTI (urinary tract infection)               Past Surgical History:         Procedure  Laterality  Date           ?  HX TONSILLECTOMY         ?  HX TONSILLECTOMY         ?  HX UROLOGICAL    09/03/2018          TRUS Volume:  23.56 cm3 .  Benign pathology 12/12 cores.  PSA 6.3  Dr Cleda Mccreedy  Social History          Tobacco Use         ?  Smoking status:  Former Smoker              Last attempt to quit:  2016         Years since quitting:  3.9         ?  Smokeless tobacco:  Never Used       Substance Use Topics         ?  Alcohol use:  Yes              Frequency:  2-3 times a week         ?  Drug use:  Never           No Known Allergies        Family History       Adopted: Yes       Family history unknown: Yes             Current Outpatient Medications          Medication  Sig  Dispense  Refill           ?  acetaminophen (TYLENOL) 325 mg tablet  Take  by mouth every four (4) hours as needed for Pain.               ?  ibuprofen (MOTRIN) 200 mg tablet  Take 600-800 mg by mouth.                  REVIEW OF SYSTEMS:   Constitutional: Fever: No   Skin: Rash: No   HEENT: Hearing difficulty: No   Eyes: Blurred vision: No   Cardiovascular: Chest pain: No   Respiratory: Shortness of breath: No   Gastrointestinal: Nausea/vomiting: No   Musculoskeletal: Back pain: No   Neurological: Weakness: No   Psychological: Memory loss: No   Comments/additional findings:          PHYSICAL EXAMINATION:    Visit Vitals      BP  (!) 150/95     Pulse  81     Temp  97.8 ??F (36.6 ??C)     Resp  18     Ht  6\' 6"  (1.981 m)     Wt  228 lb (103.4 kg)        BMI  26.35 kg/m??        Constitutional: Well developed, no acute distress.    Eyes:  Conjunctiva normal.   Ears:  External ear normal.   Nose/Throat:  External nose normal.   CV:  Heart rate regular, No peripheral swelling noted.   Respiratory: No respiratory distress, No audible wheeze.   Skin: No rash, No ulcer.     Neuro/Psych:  Patient with appropriate affect.  Alert and oriented x 3.     Gait:  Normal.         REVIEW OF LABS AND IMAGING:          Results for orders placed or performed in  visit on 10/07/18     PSA, DIAGNOSTIC (PROSTATE SPECIFIC AG)         Result  Value  Ref Range            Prostate Specific Ag  5.9 (H)  0.0 - 4.0 ng/mL           ASSESSMENT:  ICD-10-CM  ICD-9-CM             1.  Elevated PSA  R97.20  790.93       2.  Benign prostatic hyperplasia with nocturia  N40.1  600.01              R35.1  788.43             3.  Orchitis  N45.2  604.90             4.  Personal history of urinary (tract) infection  Z87.440  V13.02                       PLAN:     ??  Will see if can find area close by that can do pelvic floor eval/pt for chronic testicular pain   ??  Continue tight underwear   ??  Psa/exam May              Patient's BMI is out of the normal parameters.  Information about BMI was given and patient was advised to follow-up with their PCP for further management.              Chief Complaint       Patient presents with        ?  Follow-up             scrotal ultrasound              A copy of today's office visit was sent to the referring physician.        Ashlea Dusing [place], DO

## 2018-10-29 NOTE — Progress Notes (Signed)
HISTORY OF PRESENT ILLNESS:  Steve Shaffer is a 62 y.o. male who presents today for f/u scrotal ultrasound which shows small bilateral hydroceles.  Prostate biopsy for psa of 6.3 with path neg except for chronic inflammation.  Patient also notes recent bilateral testicular pain.  BPH with nocturia x1, Hx UTI.  Volume of prostate 23g.        AUA Symptom Score 10/29/2018   Over the past month how often have you had the sensation that your bladder was not completely empty after you finished urinating? 0   Over the past month, how often have had to urinate again less than 2 hours after you last finished urinating? 0   Over the past month, how often have you found you stopped and started again several times when you urinated? 0   Over the past month, how often have you found it difficult to postpone urination? 0   Over the past month, how often have you had a weak urinary stream? 0   Over the past month, how often have you had to push or strain to begin urinating? 0   Over the past month, how many times did you most typically get up to urinate from the time you went to bed at night until the time you got up in the morning? 1   AUA Score 1   If you were to spend the rest of your life with your urinary condition the way it is now, how would you feel about that? Unhappy       Past Medical History:   Diagnosis Date   ??? Elevated liver enzymes    ??? History of cancer tonsil    ??? Hypercholesterolemia    ??? Hypertension    ??? Tonsil cancer (HCC)    ??? UTI (urinary tract infection)        Past Surgical History:   Procedure Laterality Date   ??? HX TONSILLECTOMY     ??? HX TONSILLECTOMY     ??? HX UROLOGICAL  09/03/2018    TRUS Volume:  23.56 cm3 .  Benign pathology 12/12 cores.  PSA 6.3  Dr Cleda Mccreedy       Social History     Tobacco Use   ??? Smoking status: Former Smoker     Last attempt to quit: 2016     Years since quitting: 3.9   ??? Smokeless tobacco: Never Used   Substance Use Topics   ??? Alcohol use: Yes      Frequency: 2-3 times a week   ??? Drug use: Never       No Known Allergies    Family History   Adopted: Yes   Family history unknown: Yes       Current Outpatient Medications   Medication Sig Dispense Refill   ??? acetaminophen (TYLENOL) 325 mg tablet Take  by mouth every four (4) hours as needed for Pain.     ??? ibuprofen (MOTRIN) 200 mg tablet Take 600-800 mg by mouth.           REVIEW OF SYSTEMS:  Constitutional: Fever: No  Skin: Rash: No  HEENT: Hearing difficulty: No  Eyes: Blurred vision: No  Cardiovascular: Chest pain: No  Respiratory: Shortness of breath: No  Gastrointestinal: Nausea/vomiting: No  Musculoskeletal: Back pain: No  Neurological: Weakness: No  Psychological: Memory loss: No  Comments/additional findings:       PHYSICAL EXAMINATION:   Visit Vitals  BP (!) 150/95   Pulse 81  Temp 97.8 ??F (36.6 ??C)   Resp 18   Ht 6\' 6"  (1.981 m)   Wt 228 lb (103.4 kg)   BMI 26.35 kg/m??     Constitutional: Well developed, no acute distress.   Eyes:  Conjunctiva normal.  Ears:  External ear normal.  Nose/Throat:  External nose normal.  CV:  Heart rate regular, No peripheral swelling noted.  Respiratory: No respiratory distress, No audible wheeze.  Skin: No rash, No ulcer.    Neuro/Psych:  Patient with appropriate affect.  Alert and oriented x 3.    Gait:  Normal.      REVIEW OF LABS AND IMAGING:      Results for orders placed or performed in visit on 10/07/18   PSA, DIAGNOSTIC (PROSTATE SPECIFIC AG)   Result Value Ref Range    Prostate Specific Ag 5.9 (H) 0.0 - 4.0 ng/mL       ASSESSMENT:     ICD-10-CM ICD-9-CM    1. Elevated PSA R97.20 790.93    2. Benign prostatic hyperplasia with nocturia N40.1 600.01     R35.1 788.43    3. Orchitis N45.2 604.90    4. Personal history of urinary (tract) infection Z87.440 V13.02               PLAN:    ?? Will see if can find area close by that can do pelvic floor eval/pt for chronic testicular pain  ?? Continue tight underwear  ?? Psa/exam May          Patient's BMI is out of the normal parameters.  Information about BMI was given and patient was advised to follow-up with their PCP for further management.        Chief Complaint   Patient presents with   ??? Follow-up     scrotal ultrasound         A copy of today's office visit was sent to the referring physician.      Trixie Maclaren Granby[place]ughart, DO

## 2018-12-02 ENCOUNTER — Encounter: Payer: PRIVATE HEALTH INSURANCE | Attending: Urology

## 2018-12-02 ENCOUNTER — Encounter: Attending: Urology

## 2019-04-07 ENCOUNTER — Encounter: Attending: Urology

## 2019-04-07 ENCOUNTER — Encounter: Payer: PRIVATE HEALTH INSURANCE | Attending: Urology
